# Patient Record
Sex: Female | Born: 1985 | Race: Black or African American | Hispanic: No | Marital: Single
Health system: Southern US, Community
[De-identification: ages and names within clinical notes are randomized; demographics above are authoritative.]

## PROBLEM LIST (undated history)

## (undated) DIAGNOSIS — I10 Essential (primary) hypertension: Secondary | ICD-10-CM

## (undated) DIAGNOSIS — J45909 Unspecified asthma, uncomplicated: Secondary | ICD-10-CM

---

## 2019-10-16 ENCOUNTER — Emergency Department (HOSPITAL_COMMUNITY): Payer: 59

## 2019-10-16 ENCOUNTER — Encounter (HOSPITAL_COMMUNITY): Payer: Self-pay | Admitting: Emergency Medicine

## 2019-10-16 ENCOUNTER — Other Ambulatory Visit: Payer: Self-pay

## 2019-10-16 ENCOUNTER — Emergency Department (HOSPITAL_COMMUNITY)
Admission: EM | Admit: 2019-10-16 | Discharge: 2019-10-17 | Disposition: A | Discharge status: Home / Self Care | Payer: 59 | Attending: Emergency Medicine | Admitting: Emergency Medicine

## 2019-10-16 DIAGNOSIS — R102 Pelvic and perineal pain: Secondary | ICD-10-CM | POA: Diagnosis not present

## 2019-10-16 DIAGNOSIS — N83201 Unspecified ovarian cyst, right side: Secondary | ICD-10-CM

## 2019-10-16 DIAGNOSIS — N72 Inflammatory disease of cervix uteri: Secondary | ICD-10-CM | POA: Diagnosis not present

## 2019-10-16 DIAGNOSIS — R1031 Right lower quadrant pain: Secondary | ICD-10-CM | POA: Diagnosis present

## 2019-10-16 DIAGNOSIS — A599 Trichomoniasis, unspecified: Secondary | ICD-10-CM

## 2019-10-16 DIAGNOSIS — R197 Diarrhea, unspecified: Secondary | ICD-10-CM | POA: Diagnosis not present

## 2019-10-16 HISTORY — DX: Unspecified asthma, uncomplicated: J45.909

## 2019-10-16 HISTORY — DX: Essential (primary) hypertension: I10

## 2019-10-16 LAB — WET PREP, GENITAL
Clue Cells Wet Prep HPF POC: NONE SEEN
Sperm: NONE SEEN
Yeast Wet Prep HPF POC: NONE SEEN

## 2019-10-16 LAB — URINALYSIS, ROUTINE W REFLEX MICROSCOPIC
Bilirubin Urine: NEGATIVE
Glucose, UA: NEGATIVE mg/dL
Ketones, ur: NEGATIVE mg/dL
Nitrite: NEGATIVE
Protein, ur: NEGATIVE mg/dL
Specific Gravity, Urine: 1.006 (ref 1.005–1.030)
pH: 5 (ref 5.0–8.0)

## 2019-10-16 LAB — COMPREHENSIVE METABOLIC PANEL
ALT: 19 U/L (ref 0–44)
AST: 17 U/L (ref 15–41)
Albumin: 3.7 g/dL (ref 3.5–5.0)
Alkaline Phosphatase: 64 U/L (ref 38–126)
Anion gap: 13 (ref 5–15)
BUN: 13 mg/dL (ref 6–20)
CO2: 23 mmol/L (ref 22–32)
Calcium: 9.2 mg/dL (ref 8.9–10.3)
Chloride: 106 mmol/L (ref 98–111)
Creatinine, Ser: 1.02 mg/dL — ABNORMAL HIGH (ref 0.44–1.00)
GFR calc Af Amer: >60 mL/min (ref >60)
GFR calc non Af Amer: >60 mL/min (ref >60)
Glucose, Bld: 129 mg/dL — ABNORMAL HIGH (ref 70–99)
Potassium: 3.9 mmol/L (ref 3.5–5.1)
Sodium: 142 mmol/L (ref 135–145)
Total Bilirubin: 0.5 mg/dL (ref 0.3–1.2)
Total Protein: 7.7 g/dL (ref 6.5–8.1)

## 2019-10-16 LAB — CBC
HCT: 48.6 % — ABNORMAL HIGH (ref 36.0–46.0)
Hemoglobin: 16.1 g/dL — ABNORMAL HIGH (ref 12.0–15.0)
MCH: 29.2 pg (ref 26.0–34.0)
MCHC: 33.1 g/dL (ref 30.0–36.0)
MCV: 88 fL (ref 80.0–100.0)
Platelets: 288 10*3/uL (ref 150–400)
RBC: 5.52 MIL/uL — ABNORMAL HIGH (ref 3.87–5.11)
RDW: 13.1 % (ref 11.5–15.5)
WBC: 16.4 10*3/uL — ABNORMAL HIGH (ref 4.0–10.5)
nRBC: 0 % (ref 0.0–0.2)

## 2019-10-16 LAB — LIPASE, BLOOD: Lipase: 22 U/L (ref 11–51)

## 2019-10-16 LAB — I-STAT BETA HCG BLOOD, ED (MC, WL, AP ONLY): I-stat hCG, quantitative: <5 m[IU]/mL (ref <5)

## 2019-10-16 MED ORDER — AZITHROMYCIN 1 G PO PACK
1.0000 g | PACK | Freq: Once | ORAL | Status: AC
  Administered 2019-10-17: 1 g via ORAL
  Filled 2019-10-16: qty 1

## 2019-10-16 MED ORDER — METRONIDAZOLE 500 MG PO TABS
2000.0000 mg | ORAL_TABLET | Freq: Once | ORAL | Status: AC
  Administered 2019-10-17: 2000 mg via ORAL
  Filled 2019-10-16: qty 4

## 2019-10-16 MED ORDER — CEFTRIAXONE SODIUM 500 MG IJ SOLR
500.0000 mg | Freq: Once | INTRAMUSCULAR | Status: AC
  Administered 2019-10-17: 500 mg via INTRAMUSCULAR
  Filled 2019-10-16: qty 500

## 2019-10-16 MED ORDER — SODIUM CHLORIDE 0.9 % IV BOLUS
1000.0000 mL | Freq: Once | INTRAVENOUS | Status: AC
  Administered 2019-10-16: 1000 mL via INTRAVENOUS

## 2019-10-16 MED ORDER — MORPHINE SULFATE (PF) 4 MG/ML IV SOLN
4.0000 mg | Freq: Once | INTRAVENOUS | Status: AC
  Administered 2019-10-16: 4 mg via INTRAVENOUS
  Filled 2019-10-16: qty 1

## 2019-10-16 MED ORDER — SODIUM CHLORIDE 0.9% FLUSH: 3.0000 mL | Freq: Once | INTRAVENOUS | Status: DC

## 2019-10-16 MED ORDER — IOHEXOL 300 MG/ML  SOLN
100.0000 mL | Freq: Once | INTRAMUSCULAR | Status: AC | PRN
  Administered 2019-10-16: 100 mL via INTRAVENOUS

## 2019-10-16 MED ORDER — ONDANSETRON HCL 4 MG/2ML IJ SOLN
4.0000 mg | Freq: Once | INTRAMUSCULAR | Status: AC
  Administered 2019-10-16: 4 mg via INTRAVENOUS
  Filled 2019-10-16: qty 2

## 2019-10-16 NOTE — ED Notes (Signed)
Pt to ultrasound at this time.

## 2019-10-16 NOTE — Discharge Instructions (Addendum)
No sexual activity until 7 days after all partners treated

## 2019-10-16 NOTE — ED Provider Notes (Signed)
[facility] EMERGENCY DEPARTMENT Provider Note   CSN: [id number] Arrival date & time: 10/16/19  1554     History Chief Complaint  Patient presents with  . Abdominal Pain  . Diarrhea    Brittany Manning is a 34 y.o. female.  Is a history of hypertension.  She is complaining of 2 weeks of right lower quadrant abdominal pain and some loose stools intermittently.  She gets the pain every few hours.  When she gets the pain it is severe in nature.  Radiates through to her back.  No prior history of same.  No fevers chills nausea vomiting.  She has worsening of her right lower quadrant pain when she urinates.  Denies any vaginal discharge or bleeding.  The history is provided by the patient.  Abdominal Pain Pain location:  RLQ Pain quality: stabbing   Pain radiates to:  Back Pain severity:  Severe Onset quality:  Gradual Duration:  2 weeks Timing:  Intermittent Progression:  Unchanged Chronicity:  New Context: not trauma   Relieved by:  Nothing Worsened by:  Urination Ineffective treatments:  None tried Associated symptoms: diarrhea   Associated symptoms: no chest pain, no chills, no constipation, no cough, no dysuria, no fever, no hematemesis, no hematochezia, no hematuria, no nausea, no shortness of breath, no sore throat, no vaginal bleeding, no vaginal discharge and no vomiting   Diarrhea Associated symptoms: abdominal pain   Associated symptoms: no chills, no fever, no headaches and no vomiting        Past Medical History:  Diagnosis Date  . Asthma   . Hypertension     There are no problems to display for this patient.   History reviewed. No pertinent surgical history.   OB History   No obstetric history on file.     No family history on file.  Social History   Tobacco Use  . Smoking status: Not on file  Substance Use Topics  . Alcohol use: Not on file  . Drug use: Not on file    Home Medications Prior to Admission medications     Medication Sig Start Date End Date Taking? Authorizing Provider  albuterol (VENTOLIN HFA) 108 (90 Base) MCG/ACT inhaler Inhale 2 puffs into the lungs every 4 (four) hours as needed for wheezing or shortness of breath.   Yes [provider]  Butalbital-APAP-Caffeine (FIORICET PO) Take 1 tablet by mouth every 4 (four) hours as needed (migraine).   Yes [provider]  lisinopril-hydrochlorothiazide (ZESTORETIC) 20-25 MG tablet Take 1 tablet by mouth daily.   Yes [provider]    Allergies    Clindamycin  Review of Systems   Review of Systems  Constitutional: Negative for chills and fever.  HENT: Negative for sore throat.   Eyes: Negative for visual disturbance.  Respiratory: Negative for cough and shortness of breath.   Cardiovascular: Negative for chest pain.  Gastrointestinal: Positive for abdominal pain and diarrhea. Negative for constipation, hematemesis, hematochezia, nausea and vomiting.  Genitourinary: Negative for dysuria, hematuria, vaginal bleeding and vaginal discharge.  Musculoskeletal: Positive for back pain.  Skin: Negative for rash.  Neurological: Negative for headaches.    Physical Exam Updated Vital Signs BP (!) 139/91   Pulse (!) 116   Temp 99.6 F (37.6 C) (Oral)   Resp 18   SpO2 99%   Physical Exam Vitals and nursing note reviewed. Exam conducted with a chaperone present.  Constitutional:      General: She is not in acute  distress.    Appearance: She is well-developed.  HENT:     Head: Normocephalic and atraumatic.  Eyes:     Conjunctiva/sclera: Conjunctivae normal.  Cardiovascular:     Rate and Rhythm: Regular rhythm. Tachycardia present.     Heart sounds: No murmur.  Pulmonary:     Effort: Pulmonary effort is normal. No respiratory distress.     Breath sounds: Normal breath sounds.  Abdominal:     Palpations: Abdomen is soft.     Tenderness: There is abdominal tenderness in the right lower quadrant. There is no  guarding or rebound.  Genitourinary:    Vagina: Vaginal discharge present.     Cervix: Normal.     Uterus: Normal.      Adnexa: Right adnexa normal and left adnexa normal.  Musculoskeletal:     Cervical back: Neck supple.  Skin:    General: Skin is warm and dry.     Capillary Refill: Capillary refill takes less than 2 seconds.  Neurological:     General: No focal deficit present.     Mental Status: She is alert.     ED Results / Procedures / Treatments   Labs (all labs ordered are listed, but only abnormal results are displayed) Labs Reviewed  WET PREP, GENITAL - Abnormal; Notable for the following components:      Result Value   Trich, Wet Prep PRESENT (*)    WBC, Wet Prep HPF POC MANY (*)    All other components within normal limits  COMPREHENSIVE METABOLIC PANEL - Abnormal; Notable for the following components:   Glucose, Bld 129 (*)    Creatinine, Ser 1.02 (*)    All other components within normal limits  CBC - Abnormal; Notable for the following components:   WBC 16.4 (*)    RBC 5.52 (*)    Hemoglobin 16.1 (*)    HCT 48.6 (*)    All other components within normal limits  URINALYSIS, ROUTINE W REFLEX MICROSCOPIC - Abnormal; Notable for the following components:   Hgb urine dipstick SMALL (*)    Leukocytes,Ua SMALL (*)    Bacteria, UA RARE (*)    All other components within normal limits  LIPASE, BLOOD  I-STAT BETA HCG BLOOD, ED ([facility], [facility], [facility] ONLY)  GC/CHLAMYDIA PROBE AMP ([place]) NOT AT [facility]    EKG None  Radiology US Transvaginal Non-OB  Addendum Date: 10/17/2019   ADDENDUM REPORT: 10/17/2019 01:24 ADDENDUM: Error noted in the contact attestation statement. Should read as follows: These results were called by telephone on 10/17/2019 at 12:58 AM to provider [facility], who verbally acknowledged these results. Electronically Signed   By: Lovena Le M.D.   On: 10/17/2019 01:24   Result Date: 10/17/2019 CLINICAL DATA:  Right lower quadrant pelvic pain for  2 weeks. EXAM: TRANSABDOMINAL AND TRANSVAGINAL ULTRASOUND OF PELVIS DOPPLER ULTRASOUND OF OVARIES TECHNIQUE: Both transabdominal and transvaginal ultrasound examinations of the pelvis were performed. Transabdominal technique was performed for global imaging of the pelvis including uterus, ovaries, adnexal regions, and pelvic cul-de-sac. It was necessary to proceed with endovaginal exam following the transabdominal exam to visualize the uterus, endometrium, and ovaries. Color and duplex Doppler ultrasound was utilized to evaluate blood flow to the ovaries. COMPARISON:  CT 10/16/2019 FINDINGS: Uterus Measurements: 9.0 x 6.1 x 5.5 cm = volume: 160.3 mL. Multiple heterogeneous, predominantly hypoechoic structures in the uterus a compatible with fibroids. The first is a posterior fibroid along the right aspect of uterine body measuring 1.4 x  1.1 x 0.9 cm. The second is a fundal fibroid eccentric to the right measuring 2.1 x 1.7 x 2.2 cm. The third is a anterior fibroid at the uterine body eccentric to the left measuring 1.0 x 0.8 x 1.0 cm. Endometrium Thickness: 5 mm, non thickened.  No focal abnormality visualized. Right ovary Measurements: 3.8 x 3.0 x 3.1 cm = volume: 17.8 mL. Multiple anechoic follicles within the right ovary. There is a larger, 3 cm somewhat crenulated cystic structure with hypoechoic lace-like internal echoes which could reflect a hemorrhagic cyst. Left ovary Measurements: 2.9 x 2.5 x 1.9 cm = volume: 7 mL. Multiple normal anechoic follicles are present within the left ovary. Additional echogenic debris is seen in the left adnexa. Pulsed Doppler evaluation of both ovaries demonstrates normal low-resistance arterial and venous waveforms. Other findings There is a large volume of echogenic free fluid throughout the pelvis and bilateral adnexa. Some echogenic debris is also seen in the left adnexa, as above. IMPRESSION: Intramural hypoattenuating structures in the uterus most compatible with uterine  leiomyomas. Large volume of echogenic free fluid in the pelvis with a larger, crenulated hemorrhagic cyst in the right ovary. Findings are suggestive of a ruptured hemorrhagic cyst with hemoperitoneum. These results were called by telephone at the time of interpretation on 10/17/2019 at 12:58 am to provider [facility] , who verbally acknowledged these results. Electronically Signed: By: Lovena Le M.D. On: 10/17/2019 00:58   US Pelvis Complete  Addendum Date: 10/17/2019   ADDENDUM REPORT: 10/17/2019 01:24 ADDENDUM: Error noted in the contact attestation statement. Should read as follows: These results were called by telephone on 10/17/2019 at 12:58 AM to provider [facility], who verbally acknowledged these results. Electronically Signed   By: Lovena Le M.D.   On: 10/17/2019 01:24   Result Date: 10/17/2019 CLINICAL DATA:  Right lower quadrant pelvic pain for 2 weeks. EXAM: TRANSABDOMINAL AND TRANSVAGINAL ULTRASOUND OF PELVIS DOPPLER ULTRASOUND OF OVARIES TECHNIQUE: Both transabdominal and transvaginal ultrasound examinations of the pelvis were performed. Transabdominal technique was performed for global imaging of the pelvis including uterus, ovaries, adnexal regions, and pelvic cul-de-sac. It was necessary to proceed with endovaginal exam following the transabdominal exam to visualize the uterus, endometrium, and ovaries. Color and duplex Doppler ultrasound was utilized to evaluate blood flow to the ovaries. COMPARISON:  CT 10/16/2019 FINDINGS: Uterus Measurements: 9.0 x 6.1 x 5.5 cm = volume: 160.3 mL. Multiple heterogeneous, predominantly hypoechoic structures in the uterus a compatible with fibroids. The first is a posterior fibroid along the right aspect of uterine body measuring 1.4 x 1.1 x 0.9 cm. The second is a fundal fibroid eccentric to the right measuring 2.1 x 1.7 x 2.2 cm. The third is a anterior fibroid at the uterine body eccentric to the left measuring 1.0 x 0.8 x 1.0 cm. Endometrium  Thickness: 5 mm, non thickened.  No focal abnormality visualized. Right ovary Measurements: 3.8 x 3.0 x 3.1 cm = volume: 17.8 mL. Multiple anechoic follicles within the right ovary. There is a larger, 3 cm somewhat crenulated cystic structure with hypoechoic lace-like internal echoes which could reflect a hemorrhagic cyst. Left ovary Measurements: 2.9 x 2.5 x 1.9 cm = volume: 7 mL. Multiple normal anechoic follicles are present within the left ovary. Additional echogenic debris is seen in the left adnexa. Pulsed Doppler evaluation of both ovaries demonstrates normal low-resistance arterial and venous waveforms. Other findings There is a large volume of echogenic free fluid throughout the pelvis and bilateral adnexa. Some  echogenic debris is also seen in the left adnexa, as above. IMPRESSION: Intramural hypoattenuating structures in the uterus most compatible with uterine leiomyomas. Large volume of echogenic free fluid in the pelvis with a larger, crenulated hemorrhagic cyst in the right ovary. Findings are suggestive of a ruptured hemorrhagic cyst with hemoperitoneum. These results were called by telephone at the time of interpretation on 10/17/2019 at 12:58 am to provider [facility] , who verbally acknowledged these results. Electronically Signed: By: Lovena Le M.D. On: 10/17/2019 00:58   CT Abdomen Pelvis W Contrast  Addendum Date: 10/17/2019   ADDENDUM REPORT: 10/17/2019 01:42 ADDENDUM: A second addendum is created to clarify clinical history. 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 Given this additional history, findings and free fluid are not suspicious for metastatic disease. Electronically Signed   By: Keith Rake M.D.   On: 10/17/2019 01:42   Addendum Date: 10/17/2019   ADDENDUM REPORT: 10/17/2019 01:04 ADDENDUM: Addendum created to clarify clinical history. 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 Electronically Signed   By: Keith Rake M.D.   On: 10/17/2019 01:04   Result Date: 10/17/2019 CLINICAL DATA:  Right lower quadrant abdominal pain. Patient reports change in bowel habits with diarrhea. 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 EXAM: CT ABDOMEN AND PELVIS WITH CONTRAST TECHNIQUE: Multidetector CT imaging of the abdomen and pelvis was performed using the standard protocol following bolus administration of intravenous contrast. CONTRAST:  160mL OMNIPAQUE IOHEXOL 300 MG/ML  SOLN COMPARISON:  None. FINDINGS: Lower chest: Lung bases are clear. No consolidation or pleural fluid. Hepatobiliary: Decreased hepatic density consistent with steatosis. No focal hepatic lesion. Gallbladder physiologically distended, no calcified stone. No biliary dilatation. Pancreas: No ductal dilatation or inflammation. Spleen: Normal in size without focal abnormality. Adrenals/Urinary Tract: Normal adrenal glands. No hydronephrosis or perinephric edema. Homogeneous renal enhancement. Urinary bladder is partially distended without wall thickening. Stomach/Bowel: Bowel evaluation is limited in the absence of enteric contrast. Small hiatal hernia. Stomach otherwise unremarkable. Proximal small bowel are normal. Small bowel loops in the pelvis are prominent and fluid-filled, with mild mesenteric edema. Bowel loops measure up to 2.8 cm in dimension. Fluid within the terminal ileum without inflammation. The appendix is not definitively visualized, however there is no evidence of appendicitis. No colonic wall thickening. Distal descending and sigmoid colon are tortuous. Vascular/Lymphatic: Abdominal aorta is normal in caliber. Portal vein is patent. No enlarged upper abdominal, mesenteric, retroperitoneal, or pelvic lymph nodes. Reproductive: Prominent sized heterogeneous uterus with suspected fibroids. The left ovary is tentatively visualized and unremarkable. Right ovary is not  definitively seen. Mild fat stranding in the anterior pelvis. There is moderate volume of free fluid in the pelvis. Small amount of free fluid tracks into the lower abdominal mesentery, left mesentery and left pericolic gutter. Other: Moderate pelvic free fluid which tracks into the lower abdominal and left mesentery and left pericolic gutter. Trace perihepatic fluid. No definite omental nodularity. No free air. Small fat containing umbilical hernia. Musculoskeletal: There are no acute or suspicious osseous abnormalities. IMPRESSION: 1. Appendix not definitively visualized, however no evidence of appendicitis. 2. Small bowel loops in the pelvis are prominent and fluid-filled with mild mesenteric edema. There is also moderate volume of free fluid in the pelvis with mild fat stranding. Findings suspicious for generalized enteritis. 3. Free fluid tracks into the mesentery and left pericolic gutter. This is likely reactive related to small bowel inflammation, however given  history of ovarian carcinoma, metastatic ascites is considered. Consider follow-up imaging after resolution of symptoms. Heterogeneous uterus with probable underlying fibroids. 4. Hepatic steatosis. 5. Small hiatal hernia. Electronically Signed: By: Keith Rake M.D. On: 10/16/2019 23:28   [place] Art/Ven Flow Abd Pelv Doppler  Addendum Date: 10/17/2019   ADDENDUM REPORT: 10/17/2019 01:24 ADDENDUM: Error noted in the contact attestation statement. Should read as follows: These results were called by telephone on 10/17/2019 at 12:58 AM to provider [facility], who verbally acknowledged these results. Electronically Signed   By: Lovena Le M.D.   On: 10/17/2019 01:24   Result Date: 10/17/2019 CLINICAL DATA:  Right lower quadrant pelvic pain for 2 weeks. EXAM: TRANSABDOMINAL AND TRANSVAGINAL ULTRASOUND OF PELVIS DOPPLER ULTRASOUND OF OVARIES TECHNIQUE: Both transabdominal and transvaginal ultrasound examinations of the pelvis were performed.  Transabdominal technique was performed for global imaging of the pelvis including uterus, ovaries, adnexal regions, and pelvic cul-de-sac. It was necessary to proceed with endovaginal exam following the transabdominal exam to visualize the uterus, endometrium, and ovaries. Color and duplex Doppler ultrasound was utilized to evaluate blood flow to the ovaries. COMPARISON:  CT 10/16/2019 FINDINGS: Uterus Measurements: 9.0 x 6.1 x 5.5 cm = volume: 160.3 mL. Multiple heterogeneous, predominantly hypoechoic structures in the uterus a compatible with fibroids. The first is a posterior fibroid along the right aspect of uterine body measuring 1.4 x 1.1 x 0.9 cm. The second is a fundal fibroid eccentric to the right measuring 2.1 x 1.7 x 2.2 cm. The third is a anterior fibroid at the uterine body eccentric to the left measuring 1.0 x 0.8 x 1.0 cm. Endometrium Thickness: 5 mm, non thickened.  No focal abnormality visualized. Right ovary Measurements: 3.8 x 3.0 x 3.1 cm = volume: 17.8 mL. Multiple anechoic follicles within the right ovary. There is a larger, 3 cm somewhat crenulated cystic structure with hypoechoic lace-like internal echoes which could reflect a hemorrhagic cyst. Left ovary Measurements: 2.9 x 2.5 x 1.9 cm = volume: 7 mL. Multiple normal anechoic follicles are present within the left ovary. Additional echogenic debris is seen in the left adnexa. Pulsed Doppler evaluation of both ovaries demonstrates normal low-resistance arterial and venous waveforms. Other findings There is a large volume of echogenic free fluid throughout the pelvis and bilateral adnexa. Some echogenic debris is also seen in the left adnexa, as above. IMPRESSION: Intramural hypoattenuating structures in the uterus most compatible with uterine leiomyomas. Large volume of echogenic free fluid in the pelvis with a larger, crenulated hemorrhagic cyst in the right ovary. Findings are suggestive of a ruptured hemorrhagic cyst with hemoperitoneum.  These results were called by telephone at the time of interpretation on 10/17/2019 at 12:58 am to provider [facility] , who verbally acknowledged these results. Electronically Signed: By: Lovena Le M.D. On: 10/17/2019 00:58    Procedures Procedures (including critical care time)  Medications Ordered in ED Medications  sodium chloride 0.9 % bolus 1,000 mL (0 mLs Intravenous Stopped 10/16/19 2230)  morphine 4 MG/ML injection 4 mg (4 mg Intravenous Given 10/16/19 2133)  ondansetron (ZOFRAN) injection 4 mg (4 mg Intravenous Given 10/16/19 2133)  iohexol (OMNIPAQUE) 300 MG/ML solution 100 mL (100 mLs Intravenous Contrast Given 10/16/19 2300)  cefTRIAXone (ROCEPHIN) injection 500 mg (500 mg Intramuscular Given 10/17/19 0213)  azithromycin (ZITHROMAX) powder 1 g (1 g Oral Given 10/17/19 0214)  metroNIDAZOLE (FLAGYL) tablet 2,000 mg (2,000 mg Oral Given 10/17/19 0214)  sterile water (preservative free) injection (  Given 10/17/19 0219)  ED Course  I have reviewed the triage vital signs and the nursing notes.  Pertinent labs & imaging results that were available during my care of the patient were reviewed by me and considered in my medical decision making (see chart for details).  Clinical Course as of Oct 17 1002  Thu Oct 16, 2019  2200 Pelvic exam done with tech Ka as chaperone.  Small amount of whitish-yellow discharge in vault.  No cervical motion tenderness.  No adnexal tenderness or masses appreciated.  Patient tolerated well   [MB]  2233 Wet prep positive for trichomoniasis.  Will need discharge with Flagyl.   [MB]    Clinical Course User Index [MB] Hayden Rasmussen, MD   MDM Rules/Calculators/A&P                     This patient complains of right lower quadrant abdominal pain; this involves an extensive number of treatment Options and is a complaint that carries with it a high risk of complications and Morbidity. The differential includes right ovarian pathology, appendicitis,  renal colic, colitis,  I ordered, reviewed and interpreted labs, which included CBC showing elevated white count of 16, mild elevation of glucose and creatinine.  Normal LFTs normal lipase.  Pregnancy test negative. I ordered medication IV fluids pain medicine nausea medication. I ordered imaging studies which included CT abdomen pelvis and I independently    visualized and interpreted imaging which showed free fluid in pelvis. I have ordered pelvic u/s which is pending at time of signout to Dr Randal Buba  Previous records obtained and reviewed in Epic - none  After the interventions stated above, I reevaluated the patient and found pain to be improved. I reviewd he imaging with her. She is still pending a pelvic u/s at time of my signout to Dr Randal Buba. Plan is to followup on u/s. Dispo per results of testing.   Final Clinical Impression(s) / ED Diagnoses Final diagnoses:  Trichomoniasis  Cervicitis  Hemorrhagic cyst of right ovary  Diarrhea, unspecified type    Rx / DC Orders ED Discharge Orders    None       Hayden Rasmussen, MD 10/17/19 1008

## 2019-10-16 NOTE — ED Triage Notes (Signed)
Pt arrives with c/o lower abdominal pain and diarrhea x 2 weeks. Eating brownie brittle and drinking from coffee cup while waiting for triage.

## 2019-10-17 ENCOUNTER — Encounter (HOSPITAL_COMMUNITY): Payer: Self-pay | Admitting: Emergency Medicine

## 2019-10-17 LAB — GC/CHLAMYDIA PROBE AMP (~~LOC~~) NOT AT ARMC
Chlamydia: POSITIVE — AB
Comment: NEGATIVE
Comment: NORMAL
Neisseria Gonorrhea: NEGATIVE

## 2019-10-17 MED ORDER — STERILE WATER FOR INJECTION IJ SOLN
INTRAMUSCULAR | Status: AC
  Filled 2019-10-17: qty 10

## 2019-10-17 NOTE — ED Notes (Signed)
Pt remains in ultrasound at this time.

## 2020-10-27 ENCOUNTER — Emergency Department (HOSPITAL_COMMUNITY)
Admission: EM | Admit: 2020-10-27 | Discharge: 2020-10-28 | Disposition: A | Discharge status: Home / Self Care | Payer: Self-pay | Attending: Emergency Medicine | Admitting: Emergency Medicine

## 2020-10-27 ENCOUNTER — Encounter (HOSPITAL_COMMUNITY): Payer: Self-pay

## 2020-10-27 ENCOUNTER — Other Ambulatory Visit: Payer: Self-pay

## 2020-10-27 DIAGNOSIS — D251 Intramural leiomyoma of uterus: Secondary | ICD-10-CM | POA: Insufficient documentation

## 2020-10-27 DIAGNOSIS — R1031 Right lower quadrant pain: Secondary | ICD-10-CM

## 2020-10-27 DIAGNOSIS — D250 Submucous leiomyoma of uterus: Secondary | ICD-10-CM

## 2020-10-27 DIAGNOSIS — I10 Essential (primary) hypertension: Secondary | ICD-10-CM | POA: Insufficient documentation

## 2020-10-27 DIAGNOSIS — J45909 Unspecified asthma, uncomplicated: Secondary | ICD-10-CM | POA: Insufficient documentation

## 2020-10-27 LAB — CBC WITH DIFFERENTIAL/PLATELET
Abs Immature Granulocytes: 0.02 10*3/uL (ref 0.00–0.07)
Basophils Absolute: 0 10*3/uL (ref 0.0–0.1)
Basophils Relative: 0 %
Eosinophils Absolute: 0.1 10*3/uL (ref 0.0–0.5)
Eosinophils Relative: 1 %
HCT: 40.4 % (ref 36.0–46.0)
Hemoglobin: 13.5 g/dL (ref 12.0–15.0)
Immature Granulocytes: 0 %
Lymphocytes Relative: 29 %
Lymphs Abs: 2.6 10*3/uL (ref 0.7–4.0)
MCH: 29.6 pg (ref 26.0–34.0)
MCHC: 33.4 g/dL (ref 30.0–36.0)
MCV: 88.6 fL (ref 80.0–100.0)
Monocytes Absolute: 0.7 10*3/uL (ref 0.1–1.0)
Monocytes Relative: 7 %
Neutro Abs: 5.7 10*3/uL (ref 1.7–7.7)
Neutrophils Relative %: 63 %
Platelets: 218 10*3/uL (ref 150–400)
RBC: 4.56 MIL/uL (ref 3.87–5.11)
RDW: 13.1 % (ref 11.5–15.5)
WBC: 9.2 10*3/uL (ref 4.0–10.5)
nRBC: 0 % (ref 0.0–0.2)

## 2020-10-27 LAB — COMPREHENSIVE METABOLIC PANEL
ALT: 13 U/L (ref 0–44)
AST: 16 U/L (ref 15–41)
Albumin: 3.4 g/dL — ABNORMAL LOW (ref 3.5–5.0)
Alkaline Phosphatase: 51 U/L (ref 38–126)
Anion gap: 9 (ref 5–15)
BUN: 14 mg/dL (ref 6–20)
CO2: 26 mmol/L (ref 22–32)
Calcium: 9.1 mg/dL (ref 8.9–10.3)
Chloride: 104 mmol/L (ref 98–111)
Creatinine, Ser: 0.84 mg/dL (ref 0.44–1.00)
GFR, Estimated: >60 mL/min (ref >60)
Glucose, Bld: 120 mg/dL — ABNORMAL HIGH (ref 70–99)
Potassium: 3.8 mmol/L (ref 3.5–5.1)
Sodium: 139 mmol/L (ref 135–145)
Total Bilirubin: 0.5 mg/dL (ref 0.3–1.2)
Total Protein: 6.9 g/dL (ref 6.5–8.1)

## 2020-10-27 LAB — I-STAT BETA HCG BLOOD, ED (MC, WL, AP ONLY): I-stat hCG, quantitative: <5 m[IU]/mL (ref <5)

## 2020-10-27 LAB — URINALYSIS, ROUTINE W REFLEX MICROSCOPIC
Bilirubin Urine: NEGATIVE
Glucose, UA: NEGATIVE mg/dL
Ketones, ur: NEGATIVE mg/dL
Nitrite: NEGATIVE
Protein, ur: NEGATIVE mg/dL
Specific Gravity, Urine: 1.025 (ref 1.005–1.030)
pH: 5 (ref 5.0–8.0)

## 2020-10-27 LAB — LIPASE, BLOOD: Lipase: 30 U/L (ref 11–51)

## 2020-10-27 NOTE — ED Provider Notes (Signed)
Emergency Medicine Provider Triage Evaluation Note  Brittany Manning , a 35 y.o. female  was evaluated in triage.  Pt complains of rlq pain for 1 week. Denies nvd, urinary sxs, vaginal bleeding or vaginal discharge.  Review of Systems  Positive: abd pain Negative: Nvd, fevers, urinary sxs  Physical Exam  BP (!) 155/100 (BP Location: Left Arm)   Pulse (!) 108   Temp 99.1 F (37.3 C) (Oral)   Resp 16   SpO2 98%  Gen:   Awake, no distress   Resp:  Normal effort  MSK:   Moves extremities without difficulty  Other:  rlq ttp  Medical Decision Making  Medically screening exam initiated at 7:06 PM.  Appropriate orders placed.  Brittany Manning was informed that the remainder of the evaluation will be completed by another provider, this initial triage assessment does not replace that evaluation, and the importance of remaining in the ED until their evaluation is complete.     Brittany Manning 10/27/20 1906    [name], MD 10/27/20 2306

## 2020-10-27 NOTE — ED Triage Notes (Signed)
Pt states that she has been having RLQ pain for the past week, denies n/v/d/fevers.

## 2020-10-28 ENCOUNTER — Emergency Department (HOSPITAL_COMMUNITY): Payer: Self-pay

## 2020-10-28 ENCOUNTER — Other Ambulatory Visit: Payer: Self-pay

## 2020-10-28 MED ORDER — ONDANSETRON HCL 4 MG/2ML IJ SOLN
4.0000 mg | Freq: Once | INTRAMUSCULAR | Status: AC
  Administered 2020-10-28: 4 mg via INTRAVENOUS
  Filled 2020-10-28: qty 2

## 2020-10-28 MED ORDER — IBUPROFEN 800 MG PO TABS: 800.0000 mg | ORAL_TABLET | Freq: Three times a day (TID) | ORAL | 0 refills | Status: AC | PRN

## 2020-10-28 MED ORDER — MORPHINE SULFATE (PF) 4 MG/ML IV SOLN
4.0000 mg | Freq: Once | INTRAVENOUS | Status: AC
  Administered 2020-10-28: 4 mg via INTRAVENOUS
  Filled 2020-10-28: qty 1

## 2020-10-28 MED ORDER — SODIUM CHLORIDE 0.9 % IV BOLUS
500.0000 mL | Freq: Once | INTRAVENOUS | Status: AC
  Administered 2020-10-28: 500 mL via INTRAVENOUS

## 2020-10-28 MED ORDER — SENNOSIDES-DOCUSATE SODIUM 8.6-50 MG PO TABS: 1.0000 | ORAL_TABLET | Freq: Every evening | ORAL | 0 refills | Status: AC | PRN

## 2020-10-28 MED ORDER — OXYCODONE-ACETAMINOPHEN 5-325 MG PO TABS: 1.0000 | ORAL_TABLET | Freq: Four times a day (QID) | ORAL | 0 refills | Status: AC | PRN

## 2020-10-28 MED ORDER — KETOROLAC TROMETHAMINE 30 MG/ML IJ SOLN
30.0000 mg | Freq: Once | INTRAMUSCULAR | Status: AC
  Administered 2020-10-28: 30 mg via INTRAVENOUS
  Filled 2020-10-28: qty 1

## 2020-10-28 NOTE — Discharge Instructions (Addendum)
You were seen in the emergency department today with lower abdominal pain.  I am calling in prescriptions for pain medication but will need to have you follow-up with the OB/GYN service as an outpatient as soon as possible.  He had a fibroid low down in your uterus which is likely causing her discomfort.  I discussed your case with the OB/GYN doctor on-call and they advised very close follow-up.  If you pain worsens, you develop fever, severe bleeding, passing out you should return to the emergency department.  Do not take Percocet if you plan on driving a vehicle or need to be awake/alert for other events.  Do not take this with alcohol or other pain/sedation/anxiety medications.

## 2020-10-28 NOTE — ED Provider Notes (Signed)
Emergency Department Provider Note   I have reviewed the triage vital signs and the nursing notes.   HISTORY  Chief Complaint Abdominal Pain   HPI Brittany Manning is a 35 y.o. female with past medical history of asthma and hypertension presents emergency department constant right lower quadrant abdominal pain over the past 2 days.  Pain feels like "squeezing pain in my ovaries."  She denies any vaginal bleeding or discharge.  No dysuria, hesitancy, urgency.  No radiation of pain to the flank or chest.  No fevers or chills.  No prior history of abdominal surgery.  Pain has become increasingly severe which prompted ED evaluation today.   Past Medical History:  Diagnosis Date   Asthma    Hypertension     There are no problems to display for this patient.   History reviewed. No pertinent surgical history.  Allergies Clindamycin  No family history on file.  Social History    Review of Systems  Constitutional: No fever/chills Eyes: No visual changes. ENT: No sore throat. Cardiovascular: Denies chest pain. Respiratory: Denies shortness of breath. Gastrointestinal: Positive RLQ abdominal pain.  No nausea, no vomiting.  No diarrhea.  No constipation. Genitourinary: Negative for dysuria. Musculoskeletal: Negative for back pain. Skin: Negative for rash. Neurological: Negative for headaches, focal weakness or numbness.  10-point ROS otherwise negative.  ____________________________________________   PHYSICAL EXAM:  VITAL SIGNS: ED Triage Vitals  Enc Vitals Group     BP 10/27/20 1902 (!) 155/100     Pulse Rate 10/27/20 1902 (!) 108     Resp 10/27/20 1902 16     Temp 10/27/20 1902 99.1 F (37.3 C)     Temp Source 10/27/20 1902 Oral     SpO2 10/27/20 1902 98 %    Constitutional: Alert and oriented. Well appearing and in no acute distress. Eyes: Conjunctivae are normal.  Head: Atraumatic. Nose: No congestion/rhinnorhea. Mouth/Throat: Mucous membranes are  moist.  Neck: No stridor.   Cardiovascular: Normal rate, regular rhythm. Good peripheral circulation. Grossly normal heart sounds.   Respiratory: Normal respiratory effort.  No retractions. Lungs CTAB. Gastrointestinal: Soft w/ focal tenderness in the RLQ w/o rebound or guarding. No tenderness in the RUQ or left abdomen. No distention.  Musculoskeletal: No gross deformities of extremities. Neurologic:  Normal speech and language.  Skin:  Skin is warm, dry and intact. No rash noted.   ____________________________________________   LABS (all labs ordered are listed, but only abnormal results are displayed)  Labs Reviewed  COMPREHENSIVE METABOLIC PANEL - Abnormal; Notable for the following components:      Result Value   Glucose, Bld 120 (*)    Albumin 3.4 (*)    All other components within normal limits  URINALYSIS, ROUTINE W REFLEX MICROSCOPIC - Abnormal; Notable for the following components:   APPearance HAZY (*)    Hgb urine dipstick MODERATE (*)    Leukocytes,Ua SMALL (*)    Bacteria, UA RARE (*)    All other components within normal limits  LIPASE, BLOOD  CBC WITH DIFFERENTIAL/PLATELET  I-STAT BETA HCG BLOOD, ED ([facility], [facility], [facility] ONLY)   ____________________________________________  RADIOLOGY  CT ABDOMEN PELVIS WO CONTRAST  Result Date: 10/28/2020 CLINICAL DATA:  Right lower quadrant abdominal pain. EXAM: CT ABDOMEN AND PELVIS WITHOUT CONTRAST TECHNIQUE: Multidetector CT imaging of the abdomen and pelvis was performed following the standard protocol without IV contrast. COMPARISON:  Ultrasound pelvis 10/17/2019, CT abdomen pelvis 10/14/2019 FINDINGS: Lower chest: No acute abnormality. Hepatobiliary: No focal liver abnormality.  No gallstones, gallbladder wall thickening, or pericholecystic fluid. No biliary dilatation. Pancreas: No focal lesion. Normal pancreatic contour. No surrounding inflammatory changes. No main pancreatic ductal dilatation. Spleen: Normal in size without focal  abnormality. Adrenals/Urinary Tract: No adrenal nodule bilaterally. No nephrolithiasis, no hydronephrosis, and no contour-deforming renal mass. No ureterolithiasis or hydroureter. The urinary bladder is unremarkable. Stomach/Bowel: Stomach is within normal limits. No evidence of bowel wall thickening or dilatation. The appendix not definitely identified. No right lower quadrant inflammatory changes. Vascular/Lymphatic: No significant vascular findings are present. No enlarged abdominal or pelvic lymph nodes. Reproductive: Interval increase in size of a globular enlarged uterus with suggestion of a thickened endometrium. Bilateral adnexal regions are grossly unremarkable. Other: Trace free pelvic fluid. No intraperitoneal free gas. No organized fluid collection. Musculoskeletal: No acute or significant osseous findings. IMPRESSION: 1. Interval increase in size of a globular enlarged uterus with suggestion of a thickened endometrium. Recommend pelvic ultrasound for further evaluation. 2. The appendix is not visualized; however, no right lower quadrant inflammatory changes to suggest acute appendicitis are identified. Electronically Signed   By: Iven Finn M.D.   On: 10/28/2020 02:51   US Transvaginal Non-OB  Result Date: 10/28/2020 CLINICAL DATA:  Right lower quadrant pain. EXAM: TRANSABDOMINAL AND TRANSVAGINAL ULTRASOUND OF PELVIS DOPPLER ULTRASOUND OF OVARIES TECHNIQUE: Both transabdominal and transvaginal ultrasound examinations of the pelvis were performed. Transabdominal technique was performed for global imaging of the pelvis including uterus, ovaries, adnexal regions, and pelvic cul-de-sac. It was necessary to proceed with endovaginal exam following the transabdominal exam to visualize the adnexa. Color and duplex Doppler ultrasound was utilized to evaluate blood flow to the ovaries. COMPARISON:  CT abdomen pelvis 10/28/2020 FINDINGS: Uterus Measurements: 13.5 x 7.8 x 8.9 cm = volume: 490 mL. The uterus  is retroflexed. There is a 1.5 x 1.3 x 1.7 cm left anterior fundal intramural lesion that likely represents an intramural fibroid. Endometrium Difficulty measuring the endometrium due to endometrial canal lesions: There is an avascular 5 x 4.3 x 5 cm heterogeneous hypoechoic lesion. Right ovary Measurements: 2.8 x 2 x 2.9 cm = volume: 8.6 mL. Poor visualization. Incomplete evaluation. Left ovary Measurements: 2.8 x 1.9 x 2.2 cm = volume: 6.1 mL. Normal appearance/no adnexal mass. Color Doppler flow noted to bilateral ovaries. Normal venous waveform of the right ovary. Difficulty obtaining arterial blood flow of the right ovary. Pulsed Doppler evaluation of the left ovary demonstrates normal low-resistance arterial and venous waveforms. Other findings Trace volume pelvic fluid. IMPRESSION: 1. Interval development of a 5 x 4.3 x 5 cm heterogeneous endometrial lesion. Nonvisualization of a normal endometrium. Recommend gynecologic consultation. 2. A 1.7 cm fundal intramural fibroid. 3. Poor visualization of the right ovary with difficulty obtaining arterial blood flow of the right ovary. This may be due to technique in the setting of a retrouterine ovary versus decreased blood flow. Electronically Signed   By: Iven Finn M.D.   On: 10/28/2020 04:48   US Pelvis Complete  Result Date: 10/28/2020 CLINICAL DATA:  Right lower quadrant pain. EXAM: TRANSABDOMINAL AND TRANSVAGINAL ULTRASOUND OF PELVIS DOPPLER ULTRASOUND OF OVARIES TECHNIQUE: Both transabdominal and transvaginal ultrasound examinations of the pelvis were performed. Transabdominal technique was performed for global imaging of the pelvis including uterus, ovaries, adnexal regions, and pelvic cul-de-sac. It was necessary to proceed with endovaginal exam following the transabdominal exam to visualize the adnexa. Color and duplex Doppler ultrasound was utilized to evaluate blood flow to the ovaries. COMPARISON:  CT abdomen pelvis 10/28/2020 FINDINGS: Uterus  Measurements: 13.5 x 7.8 x 8.9 cm = volume: 490 mL. The uterus is retroflexed. There is a 1.5 x 1.3 x 1.7 cm left anterior fundal intramural lesion that likely represents an intramural fibroid. Endometrium Difficulty measuring the endometrium due to endometrial canal lesions: There is an avascular 5 x 4.3 x 5 cm heterogeneous hypoechoic lesion. Right ovary Measurements: 2.8 x 2 x 2.9 cm = volume: 8.6 mL. Poor visualization. Incomplete evaluation. Left ovary Measurements: 2.8 x 1.9 x 2.2 cm = volume: 6.1 mL. Normal appearance/no adnexal mass. Color Doppler flow noted to bilateral ovaries. Normal venous waveform of the right ovary. Difficulty obtaining arterial blood flow of the right ovary. Pulsed Doppler evaluation of the left ovary demonstrates normal low-resistance arterial and venous waveforms. Other findings Trace volume pelvic fluid. IMPRESSION: 1. Interval development of a 5 x 4.3 x 5 cm heterogeneous endometrial lesion. Nonvisualization of a normal endometrium. Recommend gynecologic consultation. 2. A 1.7 cm fundal intramural fibroid. 3. Poor visualization of the right ovary with difficulty obtaining arterial blood flow of the right ovary. This may be due to technique in the setting of a retrouterine ovary versus decreased blood flow. Electronically Signed   By: Iven Finn M.D.   On: 10/28/2020 04:48   [place] Art/Ven Flow Abd Pelv Doppler  Result Date: 10/28/2020 CLINICAL DATA:  Right lower quadrant pain. EXAM: TRANSABDOMINAL AND TRANSVAGINAL ULTRASOUND OF PELVIS DOPPLER ULTRASOUND OF OVARIES TECHNIQUE: Both transabdominal and transvaginal ultrasound examinations of the pelvis were performed. Transabdominal technique was performed for global imaging of the pelvis including uterus, ovaries, adnexal regions, and pelvic cul-de-sac. It was necessary to proceed with endovaginal exam following the transabdominal exam to visualize the adnexa. Color and duplex Doppler ultrasound was utilized to evaluate blood  flow to the ovaries. COMPARISON:  CT abdomen pelvis 10/28/2020 FINDINGS: Uterus Measurements: 13.5 x 7.8 x 8.9 cm = volume: 490 mL. The uterus is retroflexed. There is a 1.5 x 1.3 x 1.7 cm left anterior fundal intramural lesion that likely represents an intramural fibroid. Endometrium Difficulty measuring the endometrium due to endometrial canal lesions: There is an avascular 5 x 4.3 x 5 cm heterogeneous hypoechoic lesion. Right ovary Measurements: 2.8 x 2 x 2.9 cm = volume: 8.6 mL. Poor visualization. Incomplete evaluation. Left ovary Measurements: 2.8 x 1.9 x 2.2 cm = volume: 6.1 mL. Normal appearance/no adnexal mass. Color Doppler flow noted to bilateral ovaries. Normal venous waveform of the right ovary. Difficulty obtaining arterial blood flow of the right ovary. Pulsed Doppler evaluation of the left ovary demonstrates normal low-resistance arterial and venous waveforms. Other findings Trace volume pelvic fluid. IMPRESSION: 1. Interval development of a 5 x 4.3 x 5 cm heterogeneous endometrial lesion. Nonvisualization of a normal endometrium. Recommend gynecologic consultation. 2. A 1.7 cm fundal intramural fibroid. 3. Poor visualization of the right ovary with difficulty obtaining arterial blood flow of the right ovary. This may be due to technique in the setting of a retrouterine ovary versus decreased blood flow. Electronically Signed   By: Iven Finn M.D.   On: 10/28/2020 04:48    ____________________________________________   PROCEDURES  Procedure(s) performed:   Procedures  None ____________________________________________   INITIAL IMPRESSION / ASSESSMENT AND PLAN / ED COURSE  Pertinent labs & imaging results that were available during my care of the patient were reviewed by me and considered in my medical decision making (see chart for details).   Patient presents to the emergency department with right lower quadrant abdominal pain over the past 2  days.  She has focal tenderness  without rebound or guarding.  Differential includes colitis, PID, TOA, torsion, appendicitis. Have ordered TVUS as well as CT abdomen/pelvis. Labs reviewed and reassuring. No UTI. No symptoms or vaginal discharge to strongly suspect PID/TOA.   05:03 AM  Spoke with Dr. Elonda Husky regarding the CT and Pelvic US findings. Normal adnexa on CT. Poor visualization of the right ovary on [place] but with no enlarged ovary on CT and likely lower uterine fibroid he suspects this is the cause of pain. Notes that given size patient will need Gyn follow up. Pain well controlled here. Plan for pain meds at home and Gyn follow up. Provided contact info. [place] drug database reviewed.  ____________________________________________  FINAL CLINICAL IMPRESSION(S) / ED DIAGNOSES  Final diagnoses:  Right lower quadrant abdominal pain  Intramural and submucous leiomyoma of uterus     MEDICATIONS GIVEN DURING THIS VISIT:  Medications  morphine 4 MG/ML injection 4 mg (4 mg Intravenous Given 10/28/20 0100)  ondansetron (ZOFRAN) injection 4 mg (4 mg Intravenous Given 10/28/20 0100)  sodium chloride 0.9 % bolus 500 mL (0 mLs Intravenous Stopped 10/28/20 0630)  ketorolac (TORADOL) 30 MG/ML injection 30 mg (30 mg Intravenous Given 10/28/20 0525)     NEW OUTPATIENT MEDICATIONS STARTED DURING THIS VISIT:  Discharge Medication List as of 10/28/2020  6:06 AM     START taking these medications   Details  ibuprofen (ADVIL) 800 MG tablet Take 1 tablet (800 mg total) by mouth every 8 (eight) hours as needed for moderate pain., Starting Thu 10/28/2020, Normal    oxyCODONE-acetaminophen (PERCOCET/ROXICET) 5-325 MG tablet Take 1 tablet by mouth every 6 (six) hours as needed for severe pain., Starting Thu 10/28/2020, Normal    senna-docusate (SENOKOT-S) 8.6-50 MG tablet Take 1 tablet by mouth at bedtime as needed for mild constipation or moderate constipation., Starting Thu 10/28/2020, Normal        Note:  This document was prepared using Dragon  voice recognition software and may include unintentional dictation errors.  Nanda Quinton, MD, [facility] Emergency Medicine    Yacoub Diltz, [facility], MD 10/28/20 [phone]

## 2020-11-08 ENCOUNTER — Encounter: Payer: Self-pay | Admitting: Obstetrics and Gynecology

## 2022-01-20 IMAGING — US US ART/VEN ABD/PELV/SCROTUM DOPPLER LTD
1 series · 12 of 25 positions shown · non-contrast
Comparison: CT 10/16/2019
COMPARISON: CT 10/16/2019

Addendum:
CLINICAL DATA: Right lower quadrant pelvic pain for 2 weeks.

EXAM:
TRANSABDOMINAL AND TRANSVAGINAL ULTRASOUND OF PELVIS
DOPPLER ULTRASOUND OF OVARIES
TECHNIQUE: Both transabdominal and transvaginal ultrasound examinations of the
pelvis were performed. Transabdominal technique was performed for
global imaging of the pelvis including uterus, ovaries, adnexal
regions, and pelvic cul-de-sac.
It was necessary to proceed with endovaginal exam following the
transabdominal exam to visualize the uterus, endometrium, and
ovaries. Color and duplex Doppler ultrasound was utilized to
evaluate blood flow to the ovaries.

[Series 1: us pelvic doppler (torsion right/o or mass arteria · arterial · 103 acquisitions, 12 frames shown]
[im 5/103]
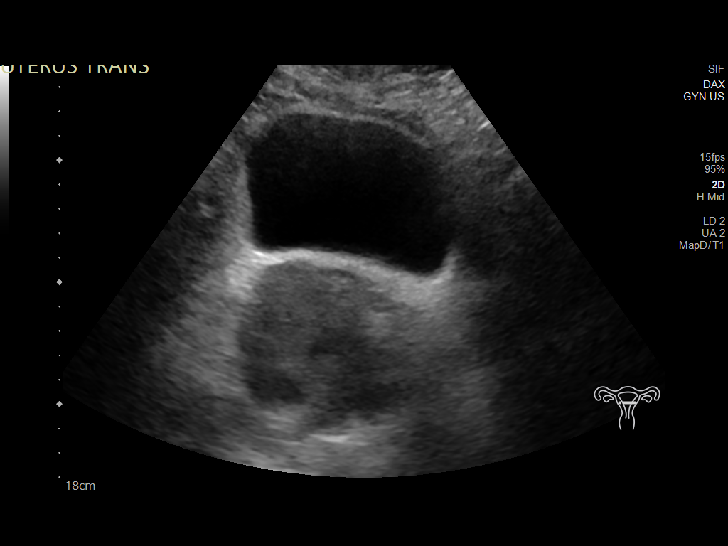
[im 13/103]
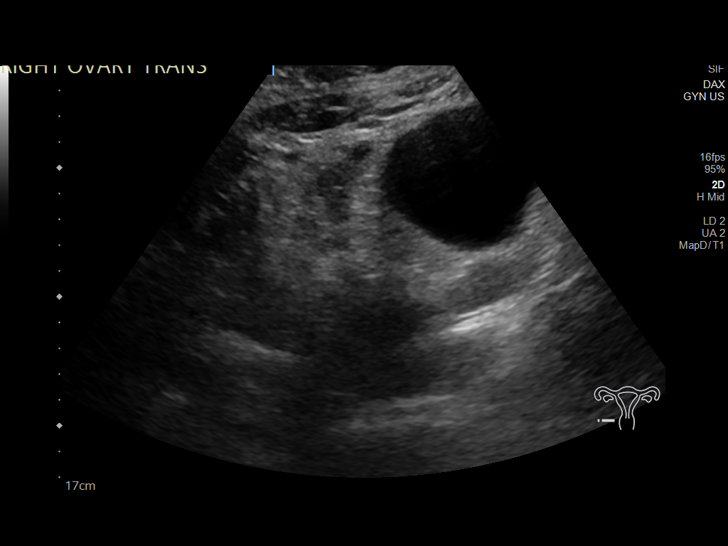
[im 22/103]
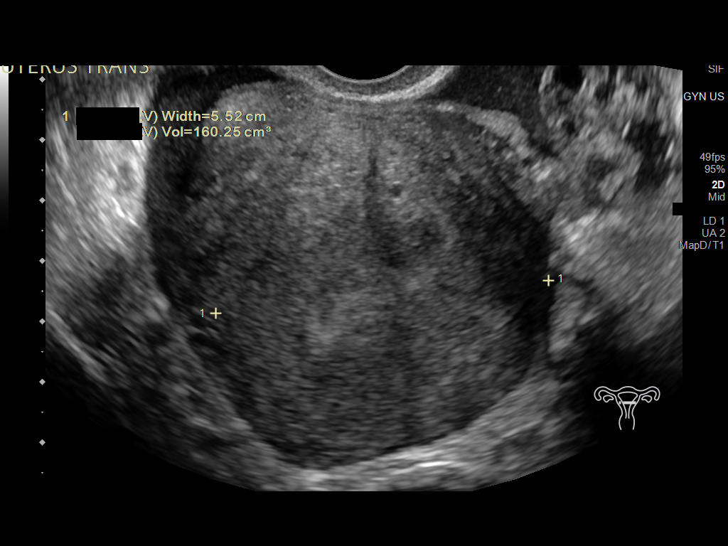
[im 30/103]
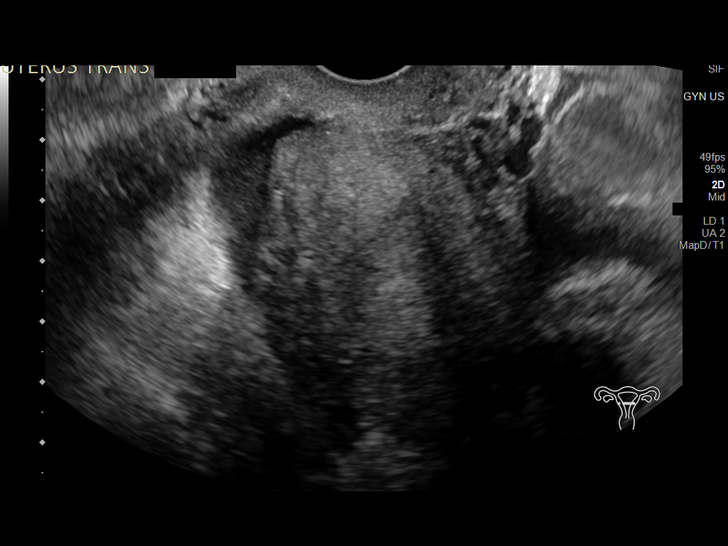
[im 39/103]
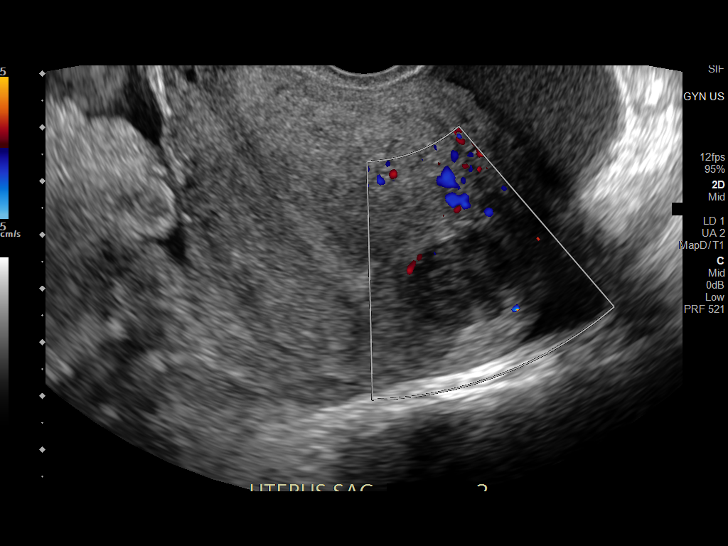
[im 47/103]
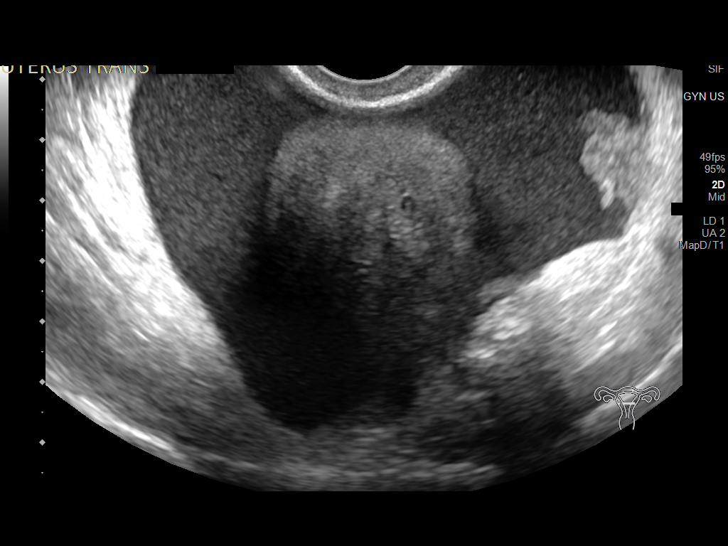
[im 56/103]
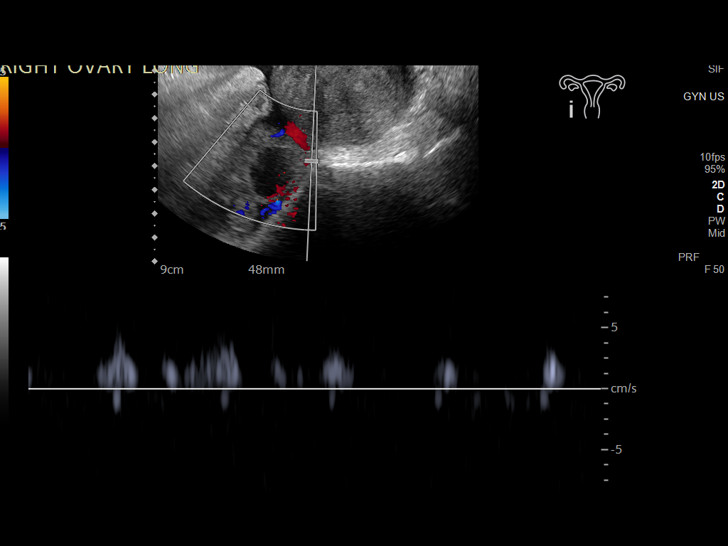
[im 64/103]
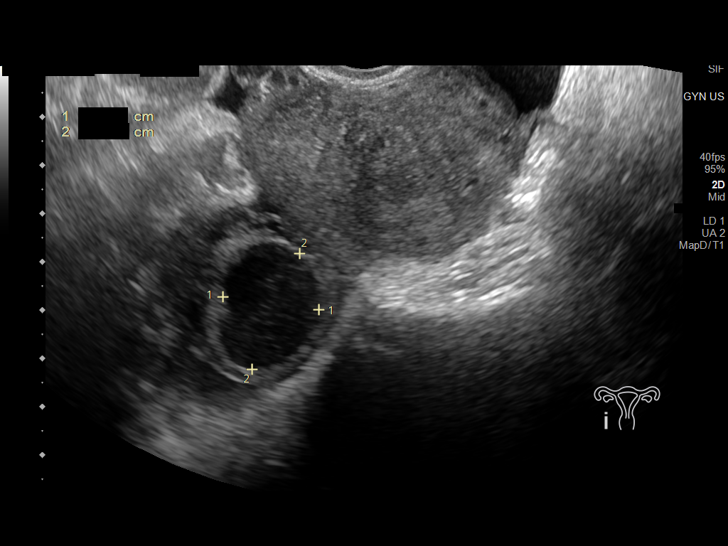
[im 73/103]
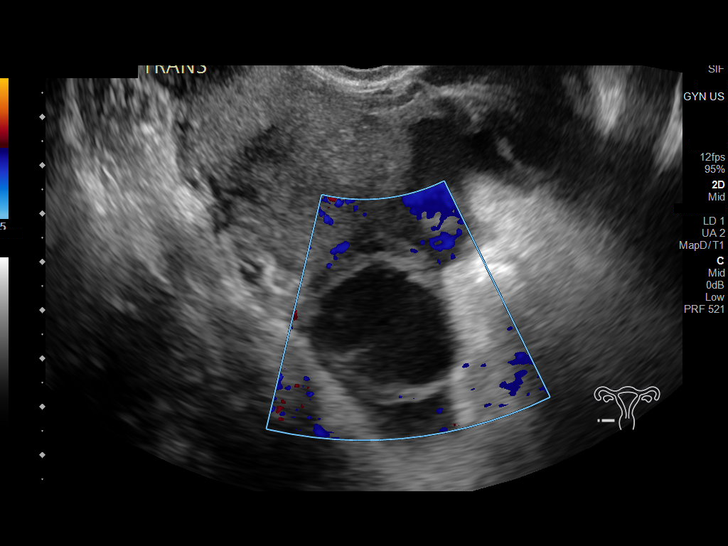
[im 81/103]
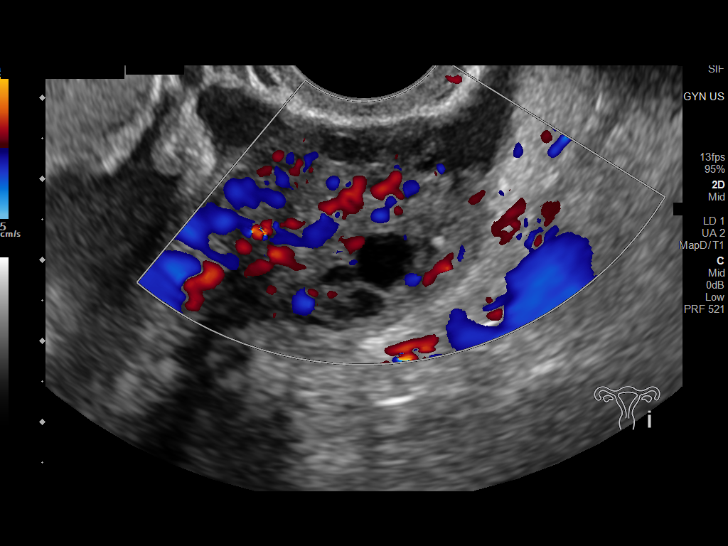
[im 90/103]
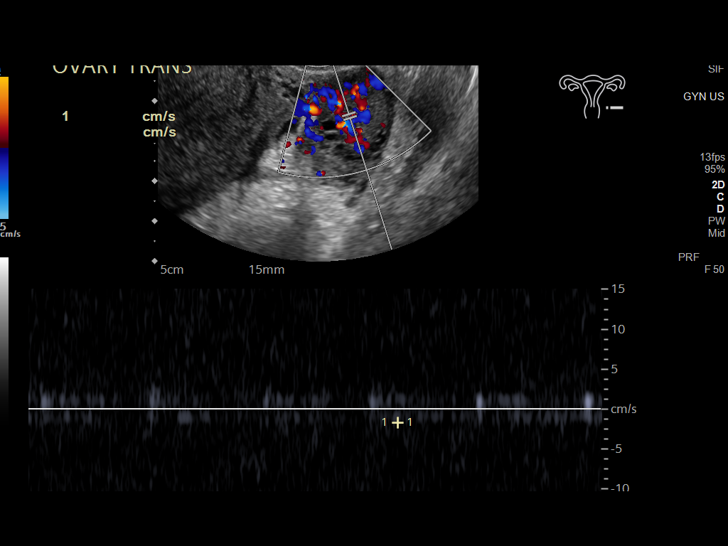
[im 98/103]
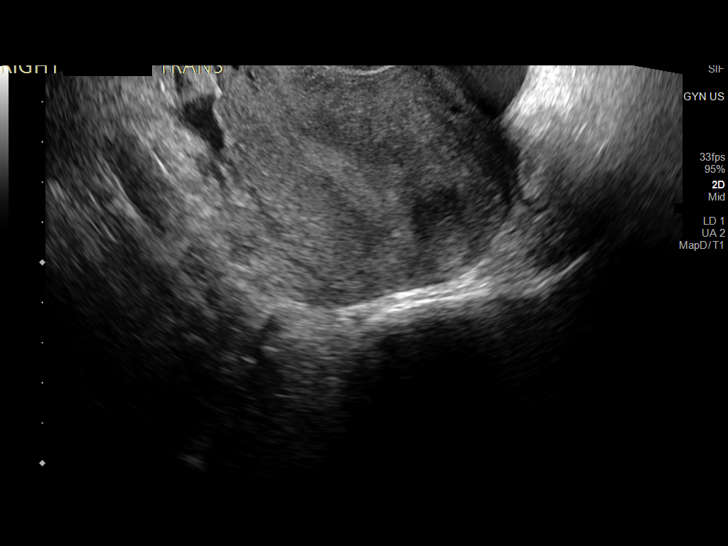

[12 of 25 positions shown; findings below may reference images not displayed]

FINDINGS: Uterus

Measurements: 9.0 x 6.1 x 5.5 cm = volume: 160.3 mL. Multiple
heterogeneous, predominantly hypoechoic structures in the uterus a
compatible with fibroids. The first is a posterior fibroid along the
right aspect of uterine body measuring 1.4 x 1.1 x 0.9 cm. The
second is a fundal fibroid eccentric to the right measuring 2.1 x
1.7 x 2.2 cm. The third is a anterior fibroid at the uterine body
eccentric to the left measuring 1.0 x 0.8 x 1.0 cm.

Endometrium

Thickness: 5 mm, non thickened.  No focal abnormality visualized.

Right ovary

Measurements: 3.8 x 3.0 x 3.1 cm = volume: 17.8 mL. Multiple
anechoic follicles within the right ovary. There is a larger, 3 cm
somewhat crenulated cystic structure with hypoechoic lace-like
internal echoes which could reflect a hemorrhagic cyst.

Left ovary

Measurements: 2.9 x 2.5 x 1.9 cm = volume: 7 mL. Multiple normal
anechoic follicles are present within the left ovary. Additional
echogenic debris is seen in the left adnexa.

Pulsed Doppler evaluation of both ovaries demonstrates normal
low-resistance arterial and venous waveforms.

Other findings

There is a large volume of echogenic free fluid throughout the
pelvis and bilateral adnexa. Some echogenic debris is also seen in
the left adnexa, as above.
IMPRESSION: Intramural hypoattenuating structures in the uterus most compatible
with uterine leiomyomas.

Large volume of echogenic free fluid in the pelvis with a larger,
crenulated hemorrhagic cyst in the right ovary. Findings are
suggestive of a ruptured hemorrhagic cyst with hemoperitoneum.

These results were called by telephone at the time of interpretation
on 10/17/2019 at [DATE] to provider BLONDU SAMUELE , who verbally
acknowledged these results.

ADDENDUM:
Error noted in the contact attestation statement. Should read as
follows:

These results were called by telephone on 10/17/2019 at [DATE] to
provider ANNASS DWIVEDI, who verbally acknowledged these results.

*** End of Addendum ***
FINDINGS: Uterus

Measurements: 9.0 x 6.1 x 5.5 cm = volume: 160.3 mL. Multiple
heterogeneous, predominantly hypoechoic structures in the uterus a
compatible with fibroids. The first is a posterior fibroid along the
right aspect of uterine body measuring 1.4 x 1.1 x 0.9 cm. The
second is a fundal fibroid eccentric to the right measuring 2.1 x
1.7 x 2.2 cm. The third is a anterior fibroid at the uterine body
eccentric to the left measuring 1.0 x 0.8 x 1.0 cm.

Endometrium

Thickness: 5 mm, non thickened.  No focal abnormality visualized.

Right ovary

Measurements: 3.8 x 3.0 x 3.1 cm = volume: 17.8 mL. Multiple
anechoic follicles within the right ovary. There is a larger, 3 cm
somewhat crenulated cystic structure with hypoechoic lace-like
internal echoes which could reflect a hemorrhagic cyst.

Left ovary

Measurements: 2.9 x 2.5 x 1.9 cm = volume: 7 mL. Multiple normal
anechoic follicles are present within the left ovary. Additional
echogenic debris is seen in the left adnexa.

Pulsed Doppler evaluation of both ovaries demonstrates normal
low-resistance arterial and venous waveforms.

Other findings

There is a large volume of echogenic free fluid throughout the
pelvis and bilateral adnexa. Some echogenic debris is also seen in
the left adnexa, as above.
IMPRESSION: Intramural hypoattenuating structures in the uterus most compatible
with uterine leiomyomas.

Large volume of echogenic free fluid in the pelvis with a larger,
crenulated hemorrhagic cyst in the right ovary. Findings are
suggestive of a ruptured hemorrhagic cyst with hemoperitoneum.

These results were called by telephone at the time of interpretation
on 10/17/2019 at [DATE] to provider BLONDU SAMUELE , who verbally
acknowledged these results.

## 2022-12-04 ENCOUNTER — Ambulatory Visit (HOSPITAL_COMMUNITY)
Admission: EM | Admit: 2022-12-04 | Discharge: 2022-12-04 | Disposition: A | Discharge status: Home / Self Care | Payer: Self-pay | Attending: Physician Assistant | Admitting: Physician Assistant

## 2022-12-04 ENCOUNTER — Ambulatory Visit (INDEPENDENT_AMBULATORY_CARE_PROVIDER_SITE_OTHER): Payer: Self-pay

## 2022-12-04 ENCOUNTER — Encounter (HOSPITAL_COMMUNITY): Payer: Self-pay | Admitting: Emergency Medicine

## 2022-12-04 DIAGNOSIS — I10 Essential (primary) hypertension: Secondary | ICD-10-CM

## 2022-12-04 DIAGNOSIS — M79672 Pain in left foot: Secondary | ICD-10-CM

## 2022-12-04 MED ORDER — AMLODIPINE BESYLATE 5 MG PO TABS: 5.0000 mg | ORAL_TABLET | Freq: Every day | ORAL | 0 refills | Status: AC

## 2022-12-04 MED ORDER — TETANUS-DIPHTH-ACELL PERTUSSIS 5-2.5-18.5 LF-MCG/0.5 IM SUSY: 0.5000 mL | PREFILLED_SYRINGE | Freq: Once | INTRAMUSCULAR | Status: DC

## 2022-12-04 NOTE — ED Triage Notes (Signed)
Pt c/o left foot swelling since Thursday and pain. Reports tried icing and elevating without relief. Pain worse with weight bearing and movement. Denies injury Hasn't taken medications

## 2022-12-04 NOTE — Discharge Instructions (Addendum)
Start amlodipine for high blood pressure, recommend follow up with PCP for recheck and any adjustments to medications.   Recommend ice, elevation, and ibuprofen as needed for foot pain.

## 2022-12-04 NOTE — ED Provider Notes (Signed)
[facility]    CSN: [id number] Arrival date & time: 12/04/22  1830      History   Chief Complaint Chief Complaint  Patient presents with   Foot Swelling    HPI Brittany Manning is a 37 y.o. female.   Patient presents with swelling to the top of her left foot along with pain that started 5 days ago.  Patient denies injury or trauma.  She reports pain is worse with ambulating.  She has tried ice and elevation with minimal relief.  She has taken nothing for the symptoms.  Patient with elevated blood pressure in clinic today.  She denies history of HTN.  She is currently on no hypertensive medications.  In reviewing chart her blood pressure has been elevated for the last 3 readings.  Listed below.  Patient currently asymptomatic.  Denies headache, chest pain, shortness of breath, lower extremity edema.  BP Readings from Last 3 Encounters: 12/04/22 : (!) 167/122 08/25/22  (!) 177/110  168/95 06/20/2022     Past Medical History:  Diagnosis Date   Asthma    Hypertension     There are no problems to display for this patient.   History reviewed. No pertinent surgical history.  OB History   No obstetric history on file.      Home Medications    Prior to Admission medications   Medication Sig Start Date End Date Taking? Authorizing Provider  acetaminophen (TYLENOL) 500 MG tablet Take 1,000 mg by mouth every 6 (six) hours as needed for moderate pain or headache.    [provider]  albuterol (VENTOLIN HFA) 108 (90 Base) MCG/ACT inhaler Inhale 2 puffs into the lungs every 4 (four) hours as needed for wheezing or shortness of breath.    [provider]  APAP-Pamabrom-Pyrilamine (MENSTRUAL PMS PO) Take 2 tablets by mouth daily as needed (cramps).    [provider]  Butalbital-APAP-Caffeine (FIORICET PO) Take 1 tablet by mouth every 4 (four) hours as needed (migraine).    [provider]  ibuprofen (ADVIL) 800 MG tablet Take 1  tablet (800 mg total) by mouth every 8 (eight) hours as needed for moderate pain. 10/28/20   Long, Arlyss Repress, MD  lisinopril-hydrochlorothiazide (ZESTORETIC) 20-25 MG tablet Take 1 tablet by mouth at bedtime.    [provider]  oxyCODONE-acetaminophen (PERCOCET/ROXICET) 5-325 MG tablet Take 1 tablet by mouth every 6 (six) hours as needed for severe pain. 10/28/20   Long, Arlyss Repress, MD  senna-docusate (SENOKOT-S) 8.6-50 MG tablet Take 1 tablet by mouth at bedtime as needed for mild constipation or moderate constipation. 10/28/20   Long, Arlyss Repress, MD  venlafaxine XR (EFFEXOR-XR) 37.5 MG 24 hr capsule Take 37.5 mg by mouth 2 (two) times daily. 10/21/20   [provider]    Family History No family history on file.  Social History     Allergies   Clindamycin   Review of Systems Review of Systems  Constitutional:  Negative for chills and fever.  HENT:  Negative for ear pain and sore throat.   Eyes:  Negative for pain and visual disturbance.  Respiratory:  Negative for cough and shortness of breath.   Cardiovascular:  Negative for chest pain and palpitations.  Gastrointestinal:  Negative for abdominal pain and vomiting.  Genitourinary:  Negative for dysuria and hematuria.  Musculoskeletal:  Positive for arthralgias. Negative for back pain.  Skin:  Negative for color change and rash.  Neurological:  Negative for seizures and syncope.  All other systems reviewed and are negative.    Physical Exam Triage Vital Signs ED Triage Vitals  Encounter Vitals Group     BP 12/04/22 1903 (!) 167/122     Systolic BP Percentile --      Diastolic BP Percentile --      Pulse Rate 12/04/22 1903 91     Resp 12/04/22 1903 17     Temp 12/04/22 1903 98.2 F (36.8 C)     Temp Source 12/04/22 1903 Oral     SpO2 12/04/22 1903 96 %     Weight --      Height --      Head Circumference --      Peak Flow --      Pain Score 12/04/22 1902 4     Pain Loc --      Pain Education --      Exclude  from Growth Chart --    No data found.  Updated Vital Signs BP (!) 167/122 (BP Location: Left Arm)   Pulse 91   Temp 98.2 F (36.8 C) (Oral)   Resp 17   LMP 11/20/2022   SpO2 96%   Visual Acuity Right Eye Distance:   Left Eye Distance:   Bilateral Distance:    Right Eye Near:   Left Eye Near:    Bilateral Near:     Physical Exam Vitals and nursing note reviewed.  Constitutional:      General: She is not in acute distress.    Appearance: She is well-developed.  HENT:     Head: Normocephalic and atraumatic.  Eyes:     Conjunctiva/sclera: Conjunctivae normal.  Cardiovascular:     Rate and Rhythm: Normal rate and regular rhythm.     Heart sounds: No murmur heard. Pulmonary:     Effort: Pulmonary effort is normal. No respiratory distress.     Breath sounds: Normal breath sounds.  Abdominal:     Palpations: Abdomen is soft.     Tenderness: There is no abdominal tenderness.  Musculoskeletal:        General: No swelling.     Cervical back: Neck supple.  Feet:     Comments: Mild swelling to dorsal aspect of left foot with tenderness to palpation over third fourth and fifth proximal metatarsals. Skin:    General: Skin is warm and dry.     Capillary Refill: Capillary refill takes less than 2 seconds.  Neurological:     Mental Status: She is alert.  Psychiatric:        Mood and Affect: Mood normal.      UC Treatments / Results  Labs (all labs ordered are listed, but only abnormal results are displayed) Labs Reviewed - No data to display  EKG   Radiology No results found.  Procedures Procedures (including critical care time)  Medications Ordered in UC Medications - No data to display  Initial Impression / Assessment and Plan / UC Course  I have reviewed the triage vital signs and the nursing notes.  Pertinent labs & imaging results that were available during my care of the patient were reviewed by me and considered in my medical decision making (see  chart for details).     Foot pain.  X-ray negative.  Supportive care discussed.  Elevated blood pressure in clinic today.  In reviewing chart pressures elevated for the last 3 office visits.  Will initiate amlodipine and advised patient to follow-up with PCP.  ED precautions given.  Patient  asymptomatic at this time Final Clinical Impressions(s) / UC Diagnoses   Final diagnoses:  None   Discharge Instructions   None    ED Prescriptions   None    PDMP not reviewed this encounter.   Ward, Tylene Fantasia, PA-C 12/04/22 2114

## 2023-02-02 IMAGING — CT CT ABD-PELV W/O CM
2 of 4 series · 16 of 46 positions shown, 18 images · non-contrast
Comparison: Ultrasound pelvis 10/17/2019, CT abdomen pelvis
10/14/2019

CLINICAL DATA: Right lower quadrant abdominal pain.

EXAM:
CT ABDOMEN AND PELVIS WITHOUT CONTRAST
TECHNIQUE: Multidetector CT imaging of the abdomen and pelvis was performed
following the standard protocol without IV contrast.

[Series 3: ap without · axial · non-contrast · 0.81mm/px · z∈[+879,+1304]mm · 13 of 97 slices shown, 15 images]
[im 6/97  soft-tissue]
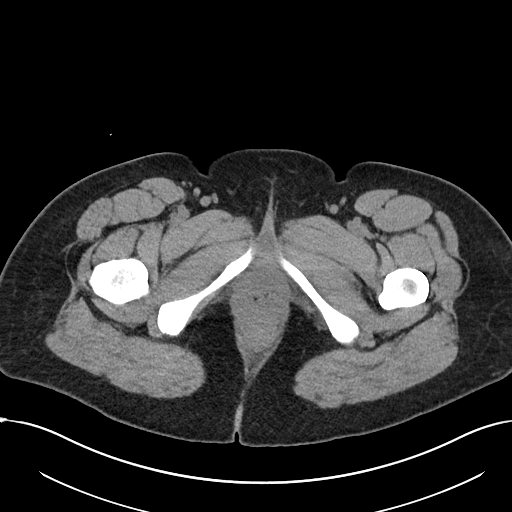
[im 6/97  bone]
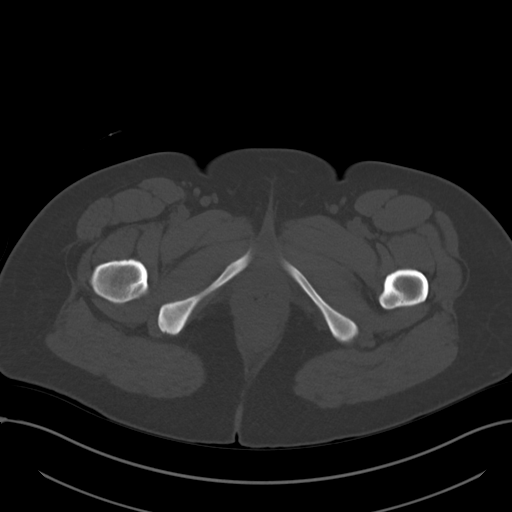
[im 16/97  soft-tissue]
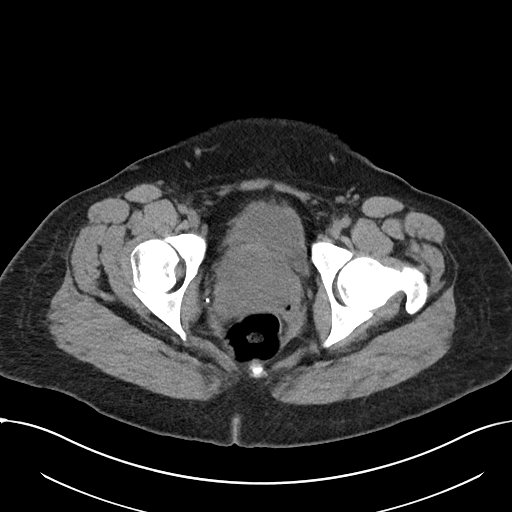
[im 21/97  soft-tissue]
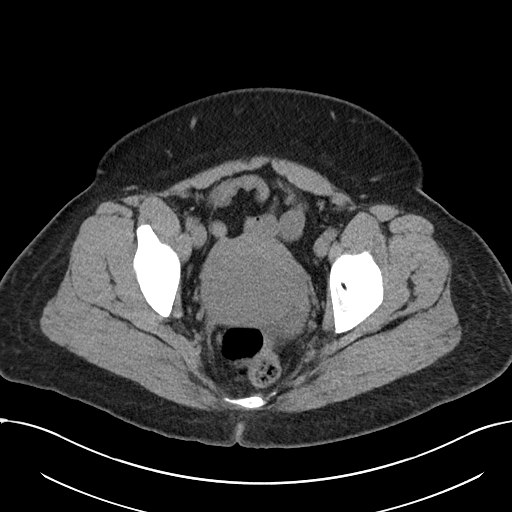
[im 26/97  soft-tissue]
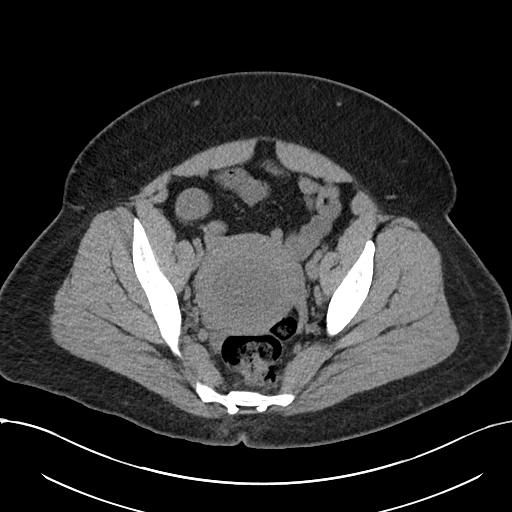
[im 36/97  soft-tissue]
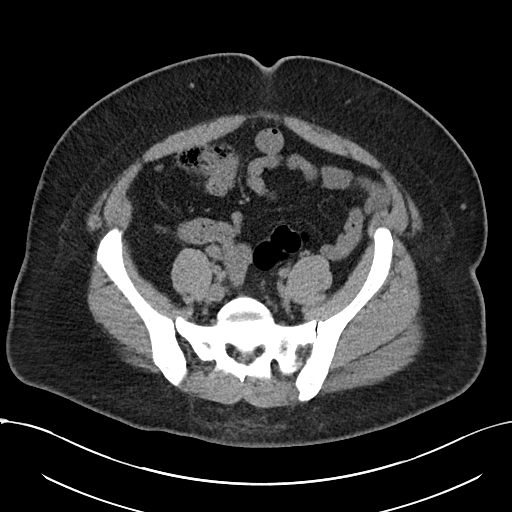
[im 41/97  soft-tissue]
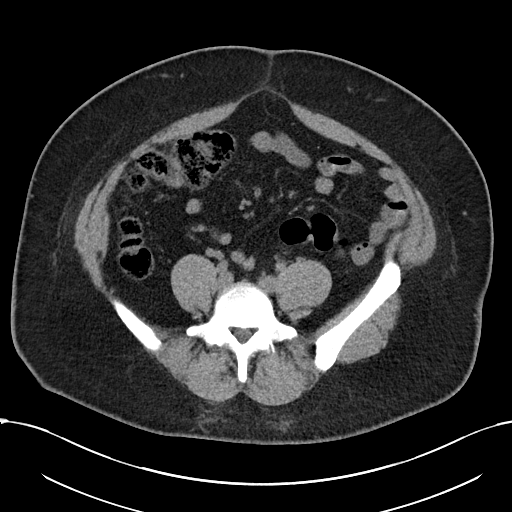
[im 51/97  soft-tissue]
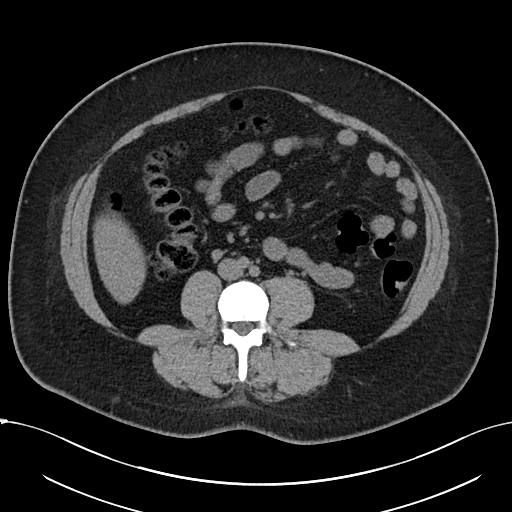
[im 56/97  soft-tissue]
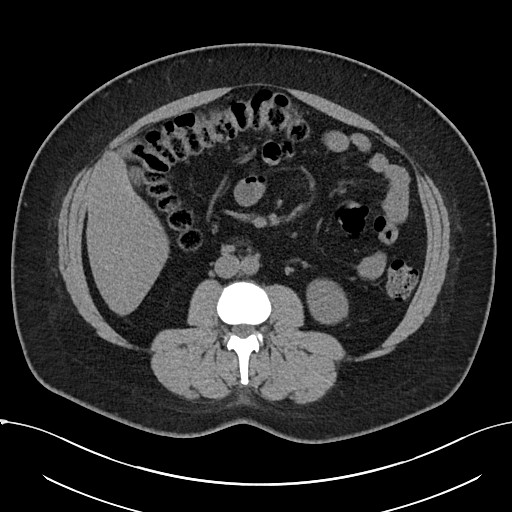
[im 61/97  soft-tissue]
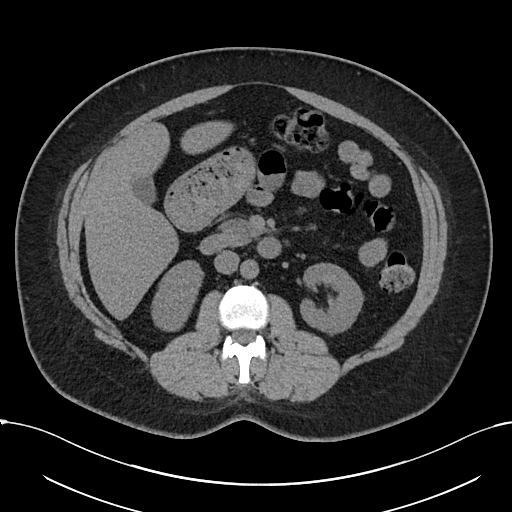
[im 61/97  bone]
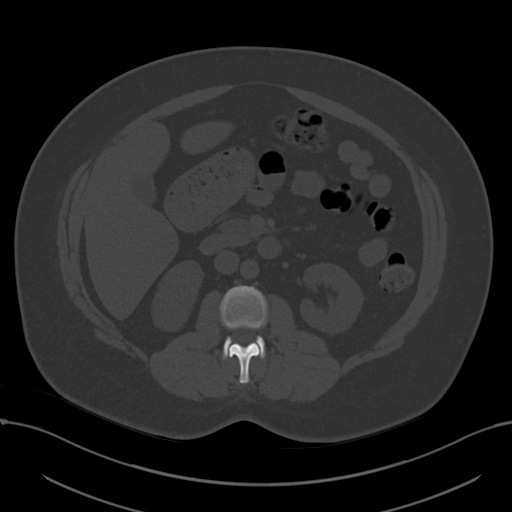
[im 71/97  soft-tissue]
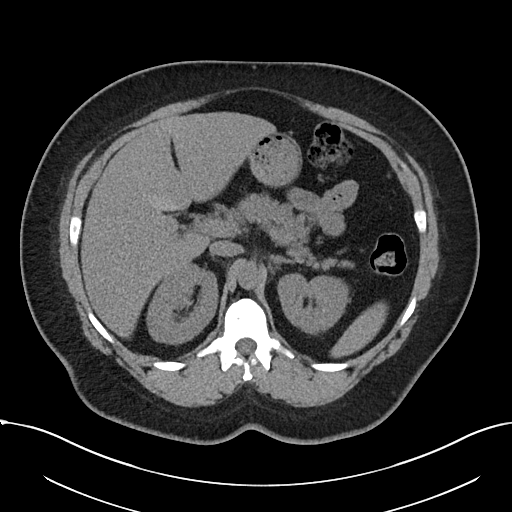
[im 76/97  soft-tissue]
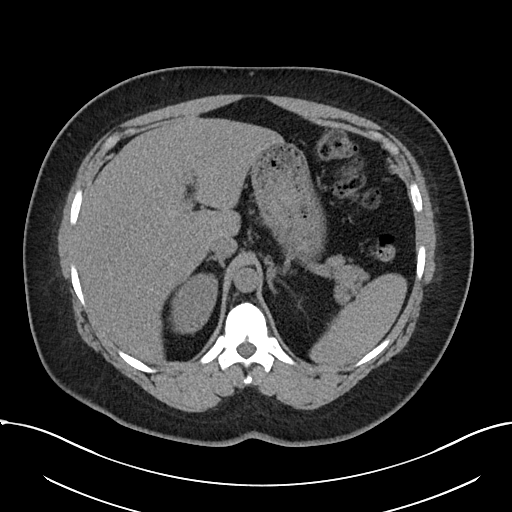
[im 81/97  soft-tissue]
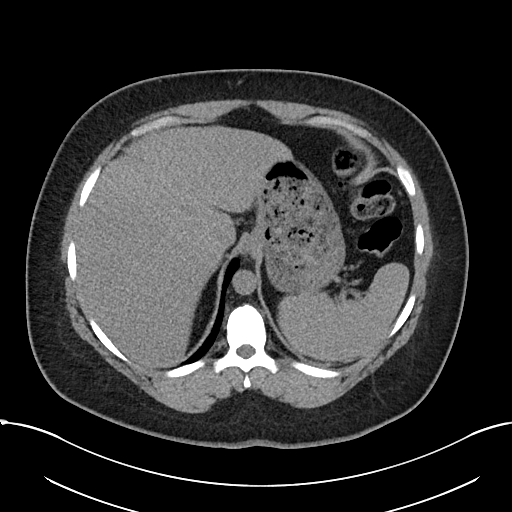
[im 91/97  soft-tissue]
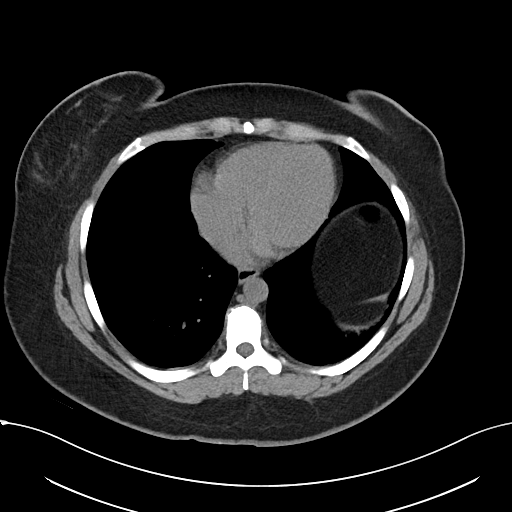

[Series 6: cor · coronal · 0.87mm/px · 3 of 111 slices shown]
[im 37/111  soft-tissue]
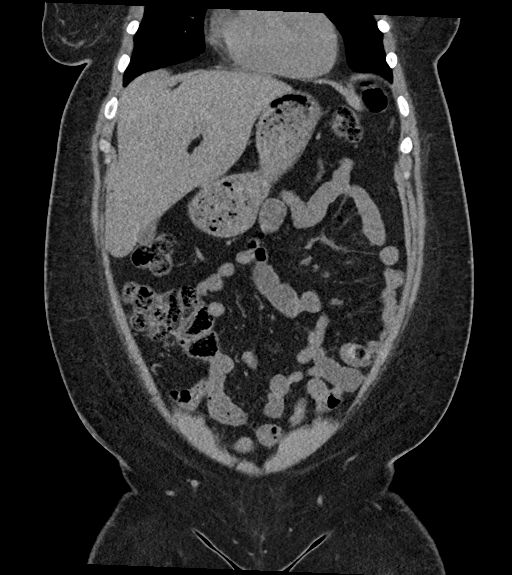
[im 49/111  soft-tissue]
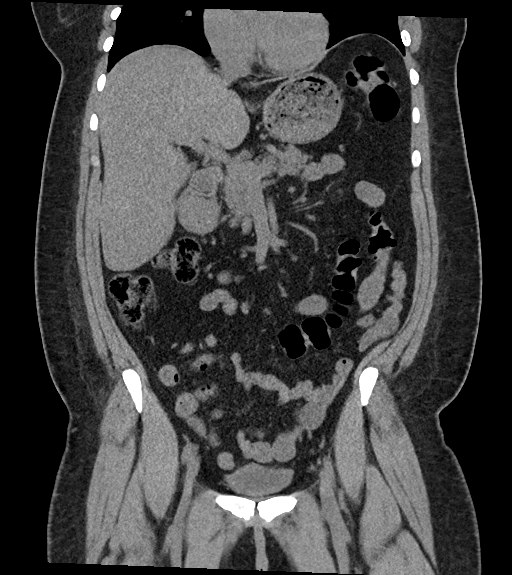
[im 62/111  soft-tissue]
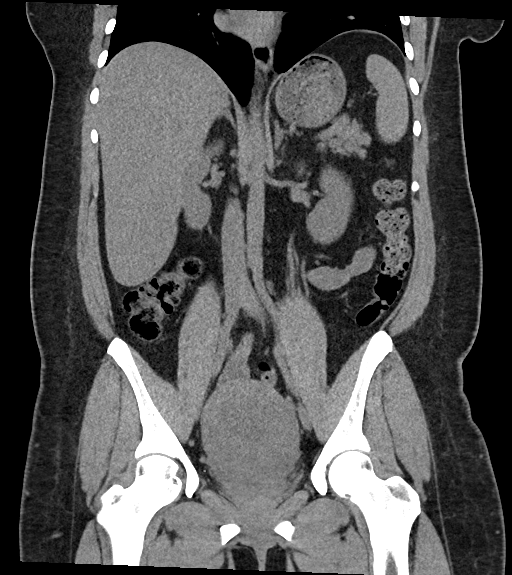

[16 of 46 positions shown; findings below may reference images not displayed]

FINDINGS: Lower chest: No acute abnormality.

Hepatobiliary: No focal liver abnormality. No gallstones,
gallbladder wall thickening, or pericholecystic fluid. No biliary
dilatation.

Pancreas: No focal lesion. Normal pancreatic contour. No surrounding
inflammatory changes. No main pancreatic ductal dilatation.

Spleen: Normal in size without focal abnormality.

Adrenals/Urinary Tract:

No adrenal nodule bilaterally.

No nephrolithiasis, no hydronephrosis, and no contour-deforming
renal mass. No ureterolithiasis or hydroureter.

The urinary bladder is unremarkable.

Stomach/Bowel: Stomach is within normal limits. No evidence of bowel
wall thickening or dilatation. The appendix not definitely
identified. No right lower quadrant inflammatory changes.

Vascular/Lymphatic: No significant vascular findings are present. No
enlarged abdominal or pelvic lymph nodes.

Reproductive: Interval increase in size of a globular enlarged
uterus with suggestion of a thickened endometrium. Bilateral adnexal
regions are grossly unremarkable.

Other: Trace free pelvic fluid. No intraperitoneal free gas. No
organized fluid collection.

Musculoskeletal: No acute or significant osseous findings.
IMPRESSION: 1. Interval increase in size of a globular enlarged uterus with
suggestion of a thickened endometrium. Recommend pelvic ultrasound
for further evaluation.
2. The appendix is not visualized; however, no right lower quadrant
inflammatory changes to suggest acute appendicitis are identified.

## 2023-02-02 IMAGING — US US PELVIS COMPLETE
1 series · 13 of 25 positions shown · non-contrast
Comparison: CT abdomen pelvis 10/28/2020

CLINICAL DATA: Right lower quadrant pain.

EXAM:
TRANSABDOMINAL AND TRANSVAGINAL ULTRASOUND OF PELVIS
DOPPLER ULTRASOUND OF OVARIES
TECHNIQUE: Both transabdominal and transvaginal ultrasound examinations of the
pelvis were performed. Transabdominal technique was performed for
global imaging of the pelvis including uterus, ovaries, adnexal
regions, and pelvic cul-de-sac.
It was necessary to proceed with endovaginal exam following the
transabdominal exam to visualize the adnexa. Color and duplex
Doppler ultrasound was utilized to evaluate blood flow to the
ovaries.

[Series 1: us pelvis (transabdominal only) · 13 of 52 slices shown]
[im 1/52]
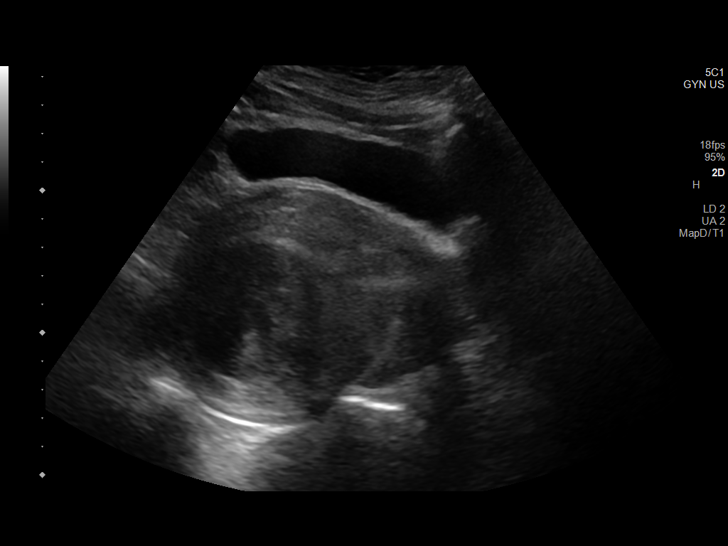
[im 5/52]
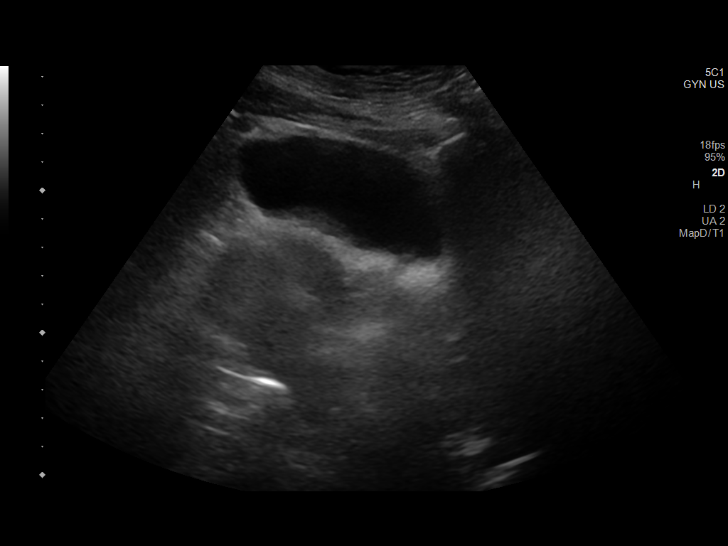
[im 9/52]
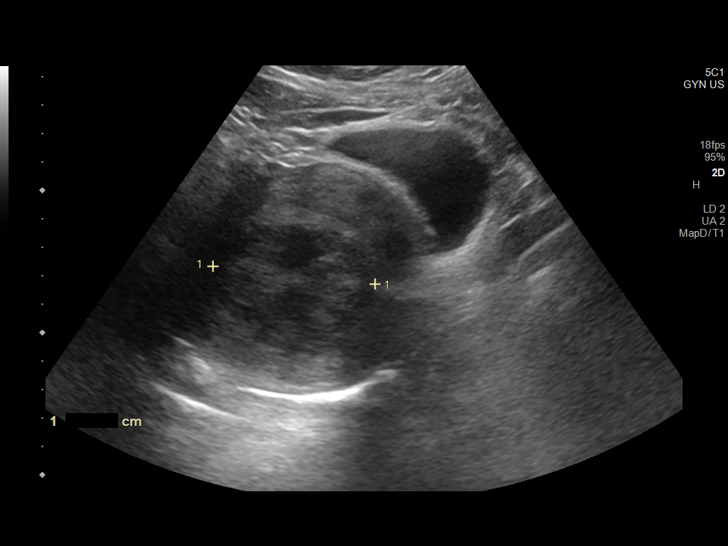
[im 13/52]
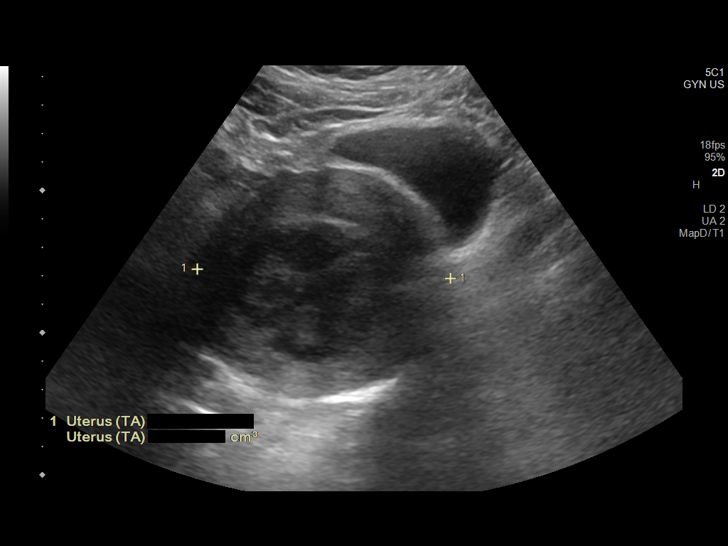
[im 18/52]
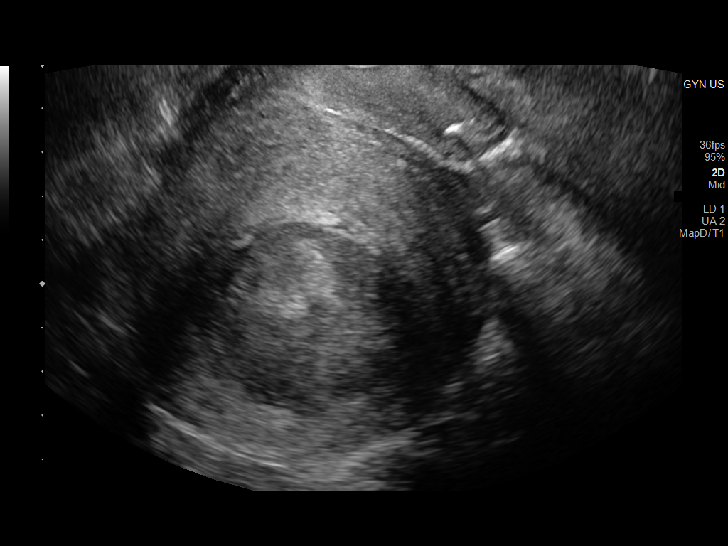
[im 22/52]
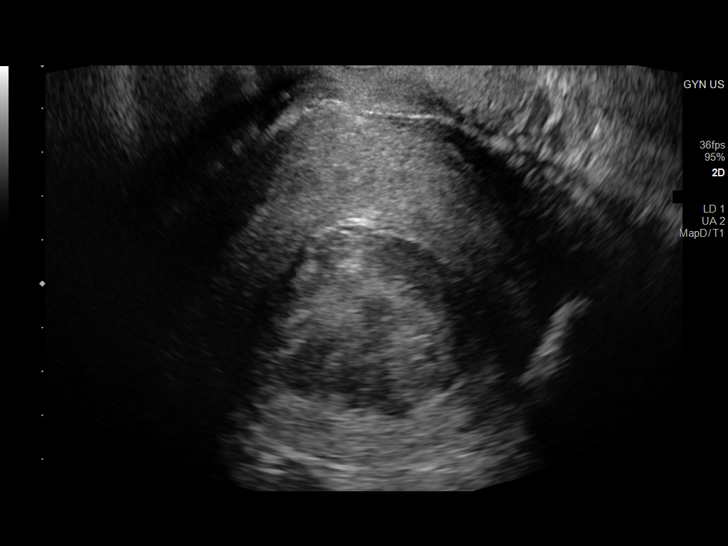
[im 26/52]
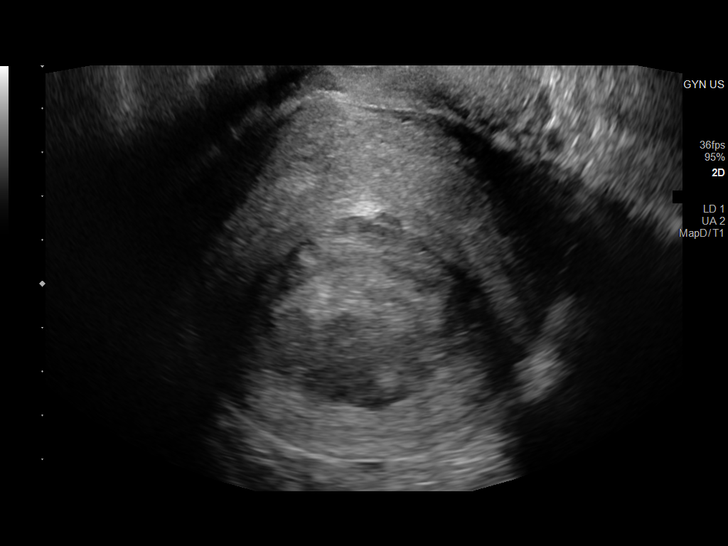
[im 30/52]
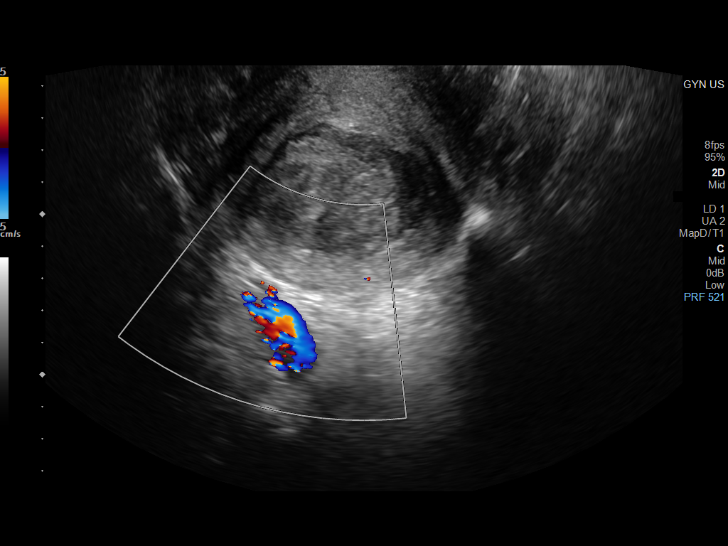
[im 35/52]
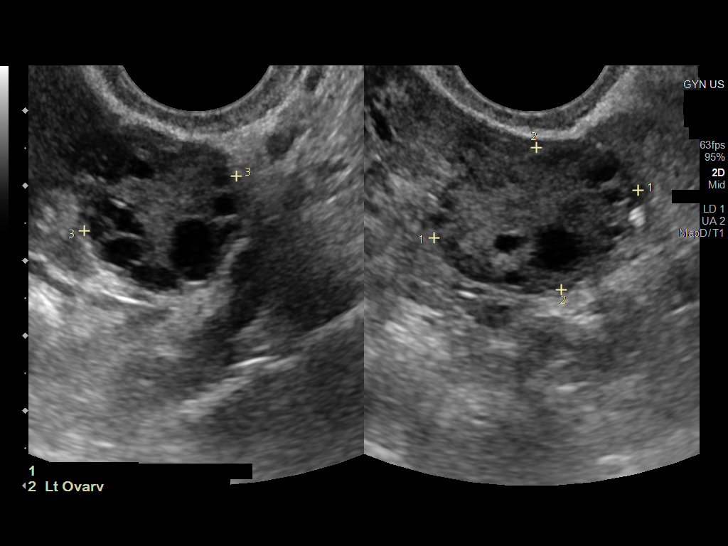
[im 39/52]
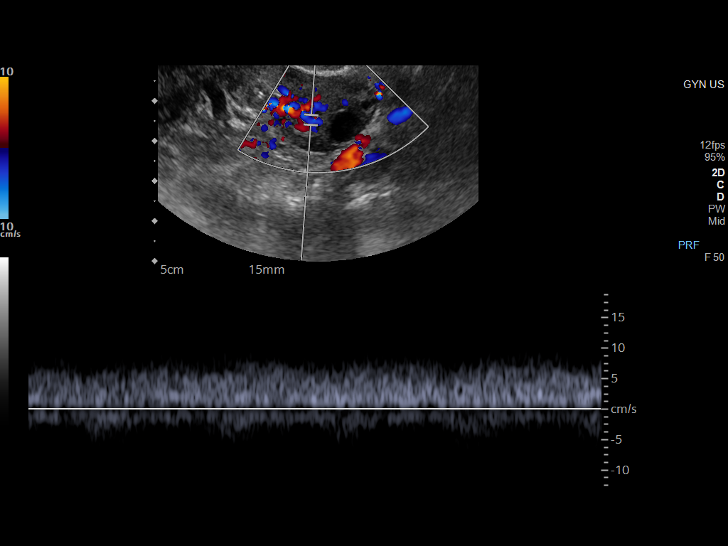
[im 43/52]
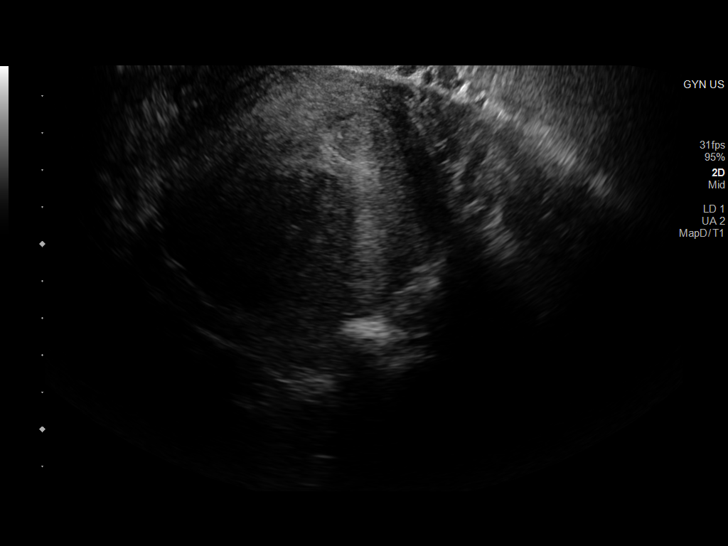
[im 47/52]
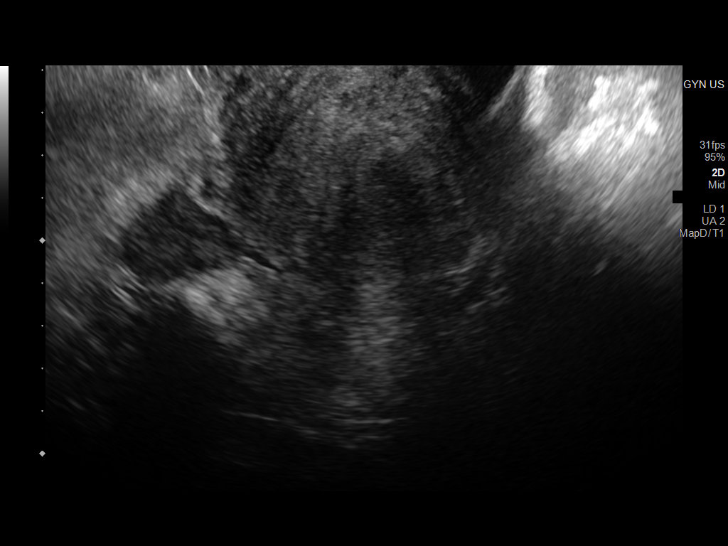
[im 52/52]
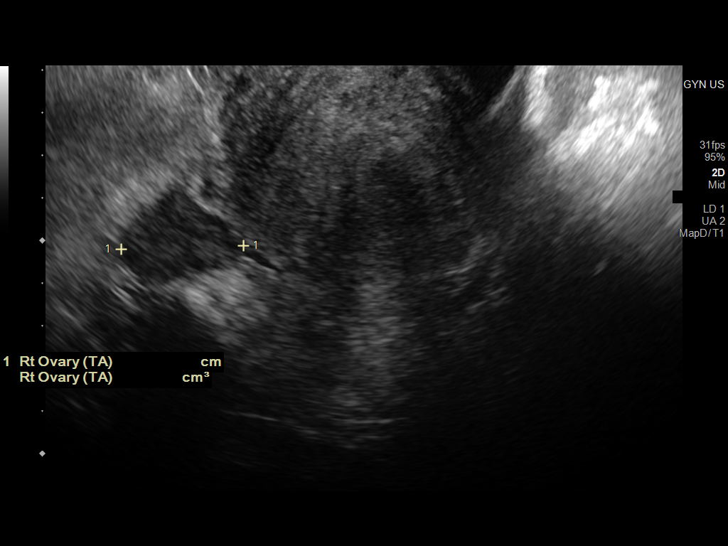

[13 of 25 positions shown; findings below may reference images not displayed]

FINDINGS: Uterus

Measurements: 13.5 x 7.8 x 8.9 cm = volume: 490 mL. The uterus is
retroflexed. There is a 1.5 x 1.3 x 1.7 cm left anterior fundal
intramural lesion that likely represents an intramural fibroid.

Endometrium

Difficulty measuring the endometrium due to endometrial canal
lesions: There is an avascular 5 x 4.3 x 5 cm heterogeneous
hypoechoic lesion.

Right ovary

Measurements: 2.8 x 2 x 2.9 cm = volume: 8.6 mL. Poor visualization.
Incomplete evaluation.

Left ovary

Measurements: 2.8 x 1.9 x 2.2 cm = volume: 6.1 mL. Normal
appearance/no adnexal mass.

Color Doppler flow noted to bilateral ovaries. Normal venous
waveform of the right ovary. Difficulty obtaining arterial blood
flow of the right ovary. Pulsed Doppler evaluation of the left ovary
demonstrates normal low-resistance arterial and venous waveforms.

Other findings

Trace volume pelvic fluid.
IMPRESSION: 1. Interval development of a 5 x 4.3 x 5 cm heterogeneous
endometrial lesion. Nonvisualization of a normal endometrium.
Recommend gynecologic consultation.
2. A 1.7 cm fundal intramural fibroid.
3. Poor visualization of the right ovary with difficulty obtaining
arterial blood flow of the right ovary. This may be due to technique
in the setting of a retrouterine ovary versus decreased blood flow.
